# Patient Record
Sex: Female | Born: 1970 | Race: White | Hispanic: No | Marital: Married | Smoking: Current every day smoker
Health system: Southern US, Community
[De-identification: ages and names within clinical notes are randomized; demographics above are authoritative.]

## PROBLEM LIST (undated history)

## (undated) DIAGNOSIS — F32A Depression, unspecified: Secondary | ICD-10-CM

## (undated) DIAGNOSIS — K824 Cholesterolosis of gallbladder: Secondary | ICD-10-CM

## (undated) DIAGNOSIS — F419 Anxiety disorder, unspecified: Secondary | ICD-10-CM

## (undated) DIAGNOSIS — J45909 Unspecified asthma, uncomplicated: Secondary | ICD-10-CM

## (undated) DIAGNOSIS — F329 Major depressive disorder, single episode, unspecified: Secondary | ICD-10-CM

## (undated) DIAGNOSIS — J302 Other seasonal allergic rhinitis: Secondary | ICD-10-CM

## (undated) DIAGNOSIS — O24419 Gestational diabetes mellitus in pregnancy, unspecified control: Secondary | ICD-10-CM

## (undated) DIAGNOSIS — I10 Essential (primary) hypertension: Secondary | ICD-10-CM

## (undated) DIAGNOSIS — K819 Cholecystitis, unspecified: Secondary | ICD-10-CM

## (undated) HISTORY — DX: Anxiety disorder, unspecified: F41.9

## (undated) HISTORY — DX: Essential (primary) hypertension: I10

## (undated) HISTORY — DX: Cholesterolosis of gallbladder: K82.4

## (undated) HISTORY — DX: Major depressive disorder, single episode, unspecified: F32.9

## (undated) HISTORY — PX: TUBAL LIGATION: SHX77

## (undated) HISTORY — DX: Unspecified asthma, uncomplicated: J45.909

## (undated) HISTORY — PX: RHINOPLASTY: SHX2354

## (undated) HISTORY — DX: Depression, unspecified: F32.A

---

## 1999-02-22 ENCOUNTER — Emergency Department (HOSPITAL_COMMUNITY): Admission: EM | Admit: 1999-02-22 | Discharge: 1999-02-22 | Payer: Self-pay | Admitting: Emergency Medicine

## 2000-03-02 ENCOUNTER — Other Ambulatory Visit: Admission: RE | Admit: 2000-03-02 | Discharge: 2000-03-02 | Payer: Self-pay | Admitting: Family Medicine

## 2001-04-19 ENCOUNTER — Other Ambulatory Visit: Admission: RE | Admit: 2001-04-19 | Discharge: 2001-04-19 | Payer: Self-pay | Admitting: Family Medicine

## 2001-08-02 ENCOUNTER — Other Ambulatory Visit: Admission: RE | Admit: 2001-08-02 | Discharge: 2001-08-02 | Payer: Self-pay | Admitting: Family Medicine

## 2003-03-08 ENCOUNTER — Other Ambulatory Visit: Admission: RE | Admit: 2003-03-08 | Discharge: 2003-03-08 | Payer: Self-pay | Admitting: *Deleted

## 2003-03-08 ENCOUNTER — Other Ambulatory Visit: Admission: RE | Admit: 2003-03-08 | Discharge: 2003-03-08 | Payer: Self-pay | Admitting: Family Medicine

## 2003-05-29 ENCOUNTER — Emergency Department (HOSPITAL_COMMUNITY): Admission: EM | Admit: 2003-05-29 | Discharge: 2003-05-29 | Payer: Self-pay | Admitting: Emergency Medicine

## 2003-06-05 ENCOUNTER — Emergency Department (HOSPITAL_COMMUNITY): Admission: EM | Admit: 2003-06-05 | Discharge: 2003-06-06 | Payer: Self-pay | Admitting: Emergency Medicine

## 2004-06-15 ENCOUNTER — Other Ambulatory Visit: Admission: RE | Admit: 2004-06-15 | Discharge: 2004-06-15 | Payer: Self-pay | Admitting: Family Medicine

## 2004-06-29 ENCOUNTER — Emergency Department (HOSPITAL_COMMUNITY): Admission: EM | Admit: 2004-06-29 | Discharge: 2004-06-29 | Payer: Self-pay | Admitting: Emergency Medicine

## 2006-12-03 ENCOUNTER — Emergency Department (HOSPITAL_COMMUNITY): Admission: EM | Admit: 2006-12-03 | Discharge: 2006-12-04 | Payer: Self-pay | Admitting: Emergency Medicine

## 2006-12-09 ENCOUNTER — Ambulatory Visit (HOSPITAL_COMMUNITY): Admission: RE | Admit: 2006-12-09 | Discharge: 2006-12-09 | Payer: Self-pay | Admitting: Family Medicine

## 2009-12-05 ENCOUNTER — Ambulatory Visit: Payer: Self-pay | Admitting: Family Medicine

## 2009-12-05 DIAGNOSIS — R300 Dysuria: Secondary | ICD-10-CM | POA: Insufficient documentation

## 2009-12-05 LAB — CONVERTED CEMR LAB
Beta hcg, urine, semiquantitative: NEGATIVE
Bilirubin Urine: NEGATIVE
Glucose, Urine, Semiquant: NEGATIVE
Ketones, urine, test strip: NEGATIVE
Protein, U semiquant: NEGATIVE
Urobilinogen, UA: 0.2
pH: 5.5

## 2009-12-06 ENCOUNTER — Encounter: Payer: Self-pay | Admitting: Family Medicine

## 2009-12-08 ENCOUNTER — Telehealth (INDEPENDENT_AMBULATORY_CARE_PROVIDER_SITE_OTHER): Payer: Self-pay

## 2010-01-23 ENCOUNTER — Ambulatory Visit (HOSPITAL_COMMUNITY)
Admission: RE | Admit: 2010-01-23 | Discharge: 2010-01-23 | Payer: Self-pay | Source: Home / Self Care | Attending: Family Medicine | Admitting: Family Medicine

## 2010-02-26 NOTE — Assessment & Plan Note (Signed)
Summary: Frequent,painful, itchy urination x 7 dys rm 3   Vital Signs:  Patient Profile:   40 Years Old Female CC:      Frequent, painful urination x 7dys LMP:     10/09/2009 Height:     68.5 inches Weight:      192 pounds O2 Sat:      100 % O2 treatment:    Room Air Temp:     98.6 degrees F oral Pulse rate:   77 / minute Pulse rhythm:   regular Resp:     16 per minute BP sitting:   126 / 81  (left arm) Cuff size:   small  Vitals Entered By: Areta Haber CMA (December 05, 2009 1:06 PM)  Menstrual History: LMP (date): 10/09/2009 LMP - Character: normal LMP - Reliable: No                  Current Allergies (reviewed today): ! PENICILLIN        History of Present Illness Chief Complaint: Frequent, painful urination x 7dys History of Present Illness:  Subjective:  Patient presents complaining of UTI symptoms for one week.  Complains of dysuria, frequency, and urgency.  No hematuria or nocturia.  No abnormal vaginal discharge.  No fever/chills/sweats.  No abdominal pain or pelvic pain.  No flank pain.  No nausea/vomiting.  Last menstrual period normal.   Current Problems: DYSURIA (ICD-788.1) FAMILY HISTORY DIABETES 1ST DEGREE RELATIVE (ICD-V18.0)   Current Meds BACTRIM DS 800-160 MG TABS (SULFAMETHOXAZOLE-TRIMETHOPRIM)   REVIEW OF SYSTEMS Constitutional Symptoms      Denies fever, chills, night sweats, weight loss, weight gain, and fatigue.  Eyes       Denies change in vision, eye pain, eye discharge, glasses, contact lenses, and eye surgery. Ear/Nose/Throat/Mouth       Denies hearing loss/aids, change in hearing, ear pain, ear discharge, dizziness, frequent runny nose, frequent nose bleeds, sinus problems, sore throat, hoarseness, and tooth pain or bleeding.  Respiratory       Denies dry cough, productive cough, wheezing, shortness of breath, asthma, bronchitis, and emphysema/COPD.  Cardiovascular       Denies murmurs, chest pain, and tires easily with  exhertion.    Gastrointestinal       Denies stomach pain, nausea/vomiting, diarrhea, constipation, blood in bowel movements, and indigestion. Genitourniary       Complains of painful urination.      Denies kidney stones and loss of urinary control.      Comments: Frequent, itchy x 7 dys Neurological       Denies paralysis, seizures, and fainting/blackouts. Musculoskeletal       Denies muscle pain, joint pain, joint stiffness, decreased range of motion, redness, swelling, muscle weakness, and gout.  Skin       Denies bruising, unusual mles/lumps or sores, and hair/skin or nail changes.  Psych       Denies mood changes, temper/anger issues, anxiety/stress, speech problems, depression, and sleep problems. Other Comments: Pt states she was seen @ [facility] on 11/27/09, Dx UTI, prescribed Bactrim DS. Pt states her Sx have not resolved.    Past History:  Past Medical History: Unremarkable  Past Surgical History: Tubal ligation Rhinoplasty  Family History: Family History Diabetes 1st degree relative Family History High cholesterol Family History Hypertension  Social History: Married Current Smoker - > 1 pack daily Alcohol use-no Drug use-no Regular exercise-no Smoking Status:  current Drug Use:  no Does Patient Exercise:  no   Objective:  No acute distress  Eyes:  Pupils are equal, round, and reactive to light and accomdation.  Extraocular movement is intact.  Conjunctivae are not inflamed.  Mouth:  moist mucous membranes  Neck:  Supple.  No adenopathy is present.  Lungs:  Clear to auscultation.  Breath sounds are equal.  Heart:  Regular rate and rhythm without murmurs, rubs, or gallops.  Abdomen:  Nontender without masses or hepatosplenomegaly.  Bowel sounds are present.  No CVA or flank tenderness.  Skin:  No rash urinalysis (dipstick):  trace blood Urine HCG negative Assessment New Problems: DYSURIA (ICD-788.1) FAMILY HISTORY DIABETES 1ST DEGREE RELATIVE  (ICD-V18.0)  SYMPTOMS SUGGESTIVE OF CYSTITIS  Plan New Orders: T-Culture, Urine [[id number]] Urinalysis [81003-65000] Urine Pregnancy Test  [81025] New Patient Level III [62952] Planning Comments:   Urine culture pending.  Increase fluid intake. Begin Bactrim.  May use OTC Azo Standard. Follow-up with PCP if not improving.   The patient and/or caregiver has been counseled thoroughly with regard to medications prescribed including dosage, schedule, interactions, rationale for use, and possible side effects and they verbalize understanding.  Diagnoses and expected course of recovery discussed and will return if not improved as expected or if the condition worsens. Patient and/or caregiver verbalized understanding.   Orders Added: 1)  T-Culture, Urine [[id number]] 2)  Urinalysis [81003-65000] 3)  Urine Pregnancy Test  [81025] 4)  New Patient Level III [99203]    Laboratory Results   Urine Tests  Date/Time Received: December 05, 2009 1:38 PM  Date/Time Reported: December 05, 2009 1:38 PM   Routine Urinalysis   Color: yellow Appearance: Clear Glucose: negative   (Normal Range: Negative) Bilirubin: negative   (Normal Range: Negative) Ketone: negative   (Normal Range: Negative) Spec. Gravity: 1.010   (Normal Range: 1.003-1.035) Blood: trace-lysed   (Normal Range: Negative) pH: 5.5   (Normal Range: 5.0-8.0) Protein: negative   (Normal Range: Negative) Urobilinogen: 0.2   (Normal Range: 0-1) Nitrite: negative   (Normal Range: Negative) Leukocyte Esterace: negative   (Normal Range: Negative)    Urine HCG: negative

## 2010-02-26 NOTE — Progress Notes (Signed)
Summary: Courtesy call  Phone Note Outgoing Call   Call placed by: Areta Haber CMA,  December 08, 2009 9:53 AM Call placed to: Patient Summary of Call: Courtesy call to pt - Courtesy mess LOVM of cell number. Initial call taken by: Areta Haber CMA,  December 08, 2009 9:54 AM

## 2011-04-07 ENCOUNTER — Emergency Department
Admission: EM | Admit: 2011-04-07 | Discharge: 2011-04-07 | Disposition: A | Discharge status: Home Health | Payer: Self-pay | Source: Home / Self Care | Attending: Emergency Medicine | Admitting: Emergency Medicine

## 2011-04-07 ENCOUNTER — Encounter: Payer: Self-pay | Admitting: *Deleted

## 2011-04-07 DIAGNOSIS — B029 Zoster without complications: Secondary | ICD-10-CM

## 2011-04-07 HISTORY — DX: Other seasonal allergic rhinitis: J30.2

## 2011-04-07 MED ORDER — PREDNISONE 10 MG PO TABS: ORAL_TABLET | ORAL | Status: DC

## 2011-04-07 MED ORDER — ACYCLOVIR 400 MG PO TABS: 800.0000 mg | ORAL_TABLET | Freq: Every day | ORAL | Status: AC

## 2011-04-07 NOTE — ED Provider Notes (Addendum)
History     CSN: [id number]  Arrival date & time 04/07/11  1122   First MD Initiated Contact with Patient 04/07/11 1203      Chief Complaint  Patient presents with  . Rash    (Consider location/radiation/quality/duration/timing/severity/associated sxs/prior treatment) HPI This patient states that she has a stinging burning sensation that she describes as severe around her right hip and pelvis. She states that she broke out with some small red bumps on the anterior aspect. She's been using Benadryl which is not helping very much. She states that she was handling firewood about 3 days ago and her symptoms started after this so she wonders if she has poison ivy. She has been under a lot of stress recently with the death of her father and other home stressors. No fever chills or nausea or vomiting. She does not have health insurance.  Past Medical History  Diagnosis Date  . Seasonal allergies     Past Surgical History  Procedure Date  . Rhinoplasty   . Tubal ligation     Family History  Problem Relation Age of Onset  . Hypertension Mother   . Diabetes Mother   . Seizures Father   . Hypertension Father     History  Substance Use Topics  . Smoking status: Current Everyday Smoker -- 20 years    Types: Cigarettes  . Smokeless tobacco: Not on file  . Alcohol Use: No    OB History    Grav Para Term Preterm Abortions TAB SAB Ect Mult Living                  Review of Systems  Allergies  Penicillins  Home Medications   Current Outpatient Rx  Name Route Sig Dispense Refill  . MULTIVITAL PO Oral Take by mouth.    . ACYCLOVIR 400 MG PO TABS Oral Take 2 tablets (800 mg total) by mouth 5 (five) times daily. 50 tablet 0  . PREDNISONE 10 MG PO TABS  20mg  bid for 3 days, then 10mg  bid for 3 days, then 10mg  daily for 3 days, then stop.  Disp QS 21 tablet 0    BP 128/77  Pulse 72  Temp(Src) 98.8 F (37.1 C) (Oral)  Resp 14  Ht 5' 9.5" (1.765 m)  Wt 191 lb (86.637 kg)   BMI 27.80 kg/m2  SpO2 100%  Physical Exam  Nursing note and vitals reviewed. Constitutional: She is oriented to person, place, and time. She appears well-developed and well-nourished.  HENT:  Head: Normocephalic and atraumatic.  Eyes: No scleral icterus.  Neck: Neck supple.  Cardiovascular: Regular rhythm and normal heart sounds.   Pulmonary/Chest: Effort normal and breath sounds normal. No respiratory distress.  Neurological: She is alert and oriented to person, place, and time.  Skin: Skin is warm and dry.       She has a tiny pinpoint erythema on the anterior inguinal area on the right. She also has hyperesthesia in the L1 distribution on the right side only. Full range of motion of her hip area and distal neurovascular status intact.  Psychiatric: She has a normal mood and affect. Her speech is normal.    ED Course  Procedures (including critical care time)  Labs Reviewed - No data to display No results found.   1. Shingles       MDM   Distribution of her is a neuropathy or sensation, she likely a shingles. She does not have insurance however I still feel that  she requires treatment with an antiviral. We will treat with acyclovir and also prednisone to decrease inflammation. As of right now she only has a very minimal rash on the anterior aspect, however the distribution of her sensation is consistent with herpes zoster. If she has further problems, followup with her PCP or call clinic.    Marlaine Hind, MD 04/07/11 1210  Marlaine Hind, MD 04/07/11 [phone]

## 2011-04-07 NOTE — ED Notes (Addendum)
Patient c/o small red rash to right hip/pelvis x 3 days. C/o severe burning and itching. Used benadryl without relief. Patient has had chicken pox in the past.

## 2011-04-13 ENCOUNTER — Emergency Department
Admission: EM | Admit: 2011-04-13 | Discharge: 2011-04-13 | Disposition: A | Discharge status: Home / Self Care | Payer: Self-pay | Source: Home / Self Care | Attending: Family Medicine | Admitting: Family Medicine

## 2011-04-13 ENCOUNTER — Encounter: Payer: Self-pay | Admitting: *Deleted

## 2011-04-13 DIAGNOSIS — J069 Acute upper respiratory infection, unspecified: Secondary | ICD-10-CM

## 2011-04-13 MED ORDER — AZITHROMYCIN 250 MG PO TABS: ORAL_TABLET | ORAL | Status: AC

## 2011-04-13 NOTE — Discharge Instructions (Signed)
Increase fluid intake, take antibiotics as prescribed.  Begin antihistamine of your choice (Zyrtec or Claritin).  Finish your last dose of your acyclovir. Recommend you speaking to a grief counselor in regards to your fathers recent death to assist in your coping.

## 2011-04-13 NOTE — ED Provider Notes (Signed)
History     CSN: [id number]  Arrival date & time 04/13/11  1042   First MD Initiated Contact with Patient 04/13/11 1104      Chief Complaint  Patient presents with  . Sore Throat  . Dizziness    (Consider location/radiation/quality/duration/timing/severity/associated sxs/prior treatment) Patient is a 41 y.o. female presenting with sinusitis. The history is provided by the patient.  Sinusitis  This is a recurrent problem. The current episode started more than 1 week ago (2-3 months). The problem has not changed since onset.The maximum temperature recorded prior to her arrival was 100 to 100.9 F. The fever has been present for 1 to 2 days. The pain is mild. Associated symptoms include chills, ear pain, sinus pressure and sore throat. Pertinent negatives include no swollen glands. She has tried drinking (honey, cinnamon, hot tea) for the symptoms. The treatment provided mild relief.    Past Medical History  Diagnosis Date  . Seasonal allergies     Past Surgical History  Procedure Date  . Rhinoplasty   . Tubal ligation     Family History  Problem Relation Age of Onset  . Hypertension Mother   . Diabetes Mother   . Seizures Father   . Hypertension Father     History  Substance Use Topics  . Smoking status: Current Everyday Smoker -- 1.0 packs/day for 20 years    Types: Cigarettes  . Smokeless tobacco: Not on file  . Alcohol Use: No    OB History    Grav Para Term Preterm Abortions TAB SAB Ect Mult Living                  Review of Systems  Constitutional: Positive for fever and chills.       General malaise  HENT: Positive for ear pain, sore throat, sneezing and sinus pressure.   All other systems reviewed and are negative.    Allergies  Penicillins  Home Medications   Current Outpatient Rx  Name Route Sig Dispense Refill  . ACYCLOVIR 400 MG PO TABS Oral Take 2 tablets (800 mg total) by mouth 5 (five) times daily. 50 tablet 0  . AZITHROMYCIN 250 MG PO  TABS  Azithromycin 500mg  on day 1, then 250mg  on days 2-4 6 tablet 0  . MULTIVITAL PO Oral Take by mouth.    Marland Kitchen PREDNISONE 10 MG PO TABS  20mg  bid for 3 days, then 10mg  bid for 3 days, then 10mg  daily for 3 days, then stop.  Disp QS 21 tablet 0    BP 128/82  Pulse 72  Temp(Src) 98.6 F (37 C) (Oral)  Resp 18  Ht 5' 9.5" (1.765 m)  Wt 195 lb (88.451 kg)  BMI 28.38 kg/m2  SpO2 98%  LMP 03/16/2011  Physical Exam  Constitutional: She appears well-developed.  HENT:  Head: Normocephalic and atraumatic.  Right Ear: External ear normal.  Left Ear: External ear normal.  Nose: Nose normal.  Mouth/Throat: Oropharynx is clear and moist.  Eyes: Conjunctivae and EOM are normal. Pupils are equal, round, and reactive to light.  Neck: Normal range of motion. Neck supple.       Anterior cervical tenderness  Cardiovascular: Normal rate, regular rhythm and normal heart sounds.   Pulmonary/Chest: Effort normal and breath sounds normal.  Lymphadenopathy:    She has no cervical adenopathy.  Skin: Skin is warm and dry.  Psychiatric: She has a normal mood and affect. Her behavior is normal.    ED Course  Procedures  Labs Reviewed - No data to display N/A No results found.   1. URI (upper respiratory infection)       MDM   Begin Z-pack       Lannie Fields, NP 04/13/11 1330  Lattie Haw, MD 04/13/11 [phone]

## 2011-04-13 NOTE — ED Notes (Signed)
Pt c/o sore throat, dizziness, and bilateral ear ache on and off x mths. She currently is being treated for shingles.

## 2013-04-30 ENCOUNTER — Other Ambulatory Visit (HOSPITAL_COMMUNITY): Payer: Self-pay | Admitting: *Deleted

## 2013-04-30 DIAGNOSIS — N6320 Unspecified lump in the left breast, unspecified quadrant: Secondary | ICD-10-CM

## 2013-05-09 ENCOUNTER — Ambulatory Visit (HOSPITAL_COMMUNITY)
Admission: RE | Admit: 2013-05-09 | Discharge: 2013-05-09 | Disposition: A | Discharge status: Home / Self Care | Payer: Self-pay | Source: Ambulatory Visit | Attending: *Deleted | Admitting: *Deleted

## 2013-05-09 ENCOUNTER — Other Ambulatory Visit (HOSPITAL_COMMUNITY): Payer: Self-pay | Admitting: *Deleted

## 2013-05-09 DIAGNOSIS — N6320 Unspecified lump in the left breast, unspecified quadrant: Secondary | ICD-10-CM

## 2013-05-09 DIAGNOSIS — N63 Unspecified lump in unspecified breast: Secondary | ICD-10-CM | POA: Insufficient documentation

## 2013-06-26 ENCOUNTER — Other Ambulatory Visit (HOSPITAL_COMMUNITY): Payer: Self-pay | Admitting: *Deleted

## 2013-06-26 DIAGNOSIS — N6320 Unspecified lump in the left breast, unspecified quadrant: Secondary | ICD-10-CM

## 2013-07-04 ENCOUNTER — Ambulatory Visit (HOSPITAL_COMMUNITY)
Admission: RE | Admit: 2013-07-04 | Discharge: 2013-07-04 | Disposition: A | Discharge status: Home / Self Care | Payer: Self-pay | Source: Ambulatory Visit | Attending: *Deleted | Admitting: *Deleted

## 2013-07-04 DIAGNOSIS — N63 Unspecified lump in unspecified breast: Secondary | ICD-10-CM | POA: Insufficient documentation

## 2013-07-04 DIAGNOSIS — N6320 Unspecified lump in the left breast, unspecified quadrant: Secondary | ICD-10-CM

## 2013-11-02 ENCOUNTER — Emergency Department
Admission: EM | Admit: 2013-11-02 | Discharge: 2013-11-02 | Disposition: A | Discharge status: Home / Self Care | Payer: Self-pay | Source: Home / Self Care | Attending: Emergency Medicine | Admitting: Emergency Medicine

## 2013-11-02 ENCOUNTER — Encounter: Payer: Self-pay | Admitting: Emergency Medicine

## 2013-11-02 DIAGNOSIS — J0100 Acute maxillary sinusitis, unspecified: Secondary | ICD-10-CM

## 2013-11-02 DIAGNOSIS — H6500 Acute serous otitis media, unspecified ear: Secondary | ICD-10-CM

## 2013-11-02 MED ORDER — FLUTICASONE PROPIONATE 50 MCG/ACT NA SUSP: NASAL | Status: DC

## 2013-11-02 MED ORDER — AZITHROMYCIN 250 MG PO TABS: ORAL_TABLET | ORAL | Status: DC

## 2013-11-02 NOTE — ED Provider Notes (Signed)
CSN: [id number]     Arrival date & time 11/02/13  1555 History   First MD Initiated Contact with Patient 11/02/13 1606     Chief Complaint  Patient presents with  . Facial Pain  . Ear Fullness   (Consider location/radiation/quality/duration/timing/severity/associated sxs/prior Treatment) HPI SINUSITIS  Onset: 21 days Facial/sinus pressure with discolored nasal mucus.    Severity: moderate Tried OTC meds without significant relief.  Symptoms:  + Fever  + URI prodrome with nasal congestion + Minimal swollen neck glands + mild Sinus Headache + mild ear pressure  had mild vertigo one week ago and had mild injury left wrist and left thumb after falling to brace herself. No loss of consciousness. No other musculoskeletal complaints. No numbness or weakness of left wrist or thumb. The vertigo has since resolved and she denies any focal neurologic symptoms or visual change  + Seasonal Allergy symptoms No significant Sore Throat No eye symptoms     No significant Cough No chest pain No shortness of breath  No wheezing  No Abdominal Pain No Nausea No Vomiting No diarrhea  No Myalgias No focal neurologic symptoms No syncope No Rash  No Urinary symptoms    Past Medical History  Diagnosis Date  . Seasonal allergies    Past Surgical History  Procedure Laterality Date  . Rhinoplasty    . Tubal ligation     Family History  Problem Relation Age of Onset  . Hypertension Mother   . Diabetes Mother   . Seizures Father   . Hypertension Father    History  Substance Use Topics  . Smoking status: Current Every Day Smoker -- 1.00 packs/day for 20 years    Types: Cigarettes  . Smokeless tobacco: Not on file  . Alcohol Use: No   OB History   Grav Para Term Preterm Abortions TAB SAB Ect Mult Living                 Review of Systems  Neurological: Negative for seizures, syncope and facial asymmetry.  All other systems reviewed and are negative.   Allergies   Penicillins  Home Medications   Prior to Admission medications   Medication Sig Start Date End Date Taking? Authorizing Provider  azithromycin (ZITHROMAX Z-PAK) 250 MG tablet Take 2 tablets on day one, then 1 tablet daily on days 2 through 5 11/02/13   Lajean Manesavid Massey, MD  fluticasone [facility](FLONASE) 50 MCG/ACT nasal spray 1 or 2 sprays each nostril twice a day 11/02/13   Lajean Manesavid Massey, MD  Multiple Vitamins-Minerals (MULTIVITAL PO) Take by mouth.    Historical Provider, MD   BP 126/82  Pulse 71  Temp(Src) 98.1 F (36.7 C) (Oral)  Wt 171 lb 4 oz (77.678 kg)  SpO2 97%  LMP 10/19/2013 Physical Exam  Nursing note and vitals reviewed. Constitutional: She is oriented to person, place, and time. She appears well-developed and well-nourished. No distress.  HENT:  Head: Normocephalic and atraumatic.  Right Ear: External ear and ear canal normal.  Left Ear: External ear and ear canal normal.  Nose: Mucosal edema and rhinorrhea present. Right sinus exhibits maxillary sinus tenderness. Left sinus exhibits maxillary sinus tenderness.  Mouth/Throat: Oropharynx is clear and moist. No oral lesions. No oropharyngeal exudate.  Both TMs are mildly injected with serous otitis media and air-fluid levels. TMs intact.  Eyes: Right eye exhibits no discharge. Left eye exhibits no discharge. No scleral icterus.  Neck: Neck supple.  Cardiovascular: Normal rate, regular rhythm and normal heart sounds.  Pulmonary/Chest: Effort normal and breath sounds normal. She has no wheezes. She has no rales.  Musculoskeletal:  Mild left wrist and thumb tenderness without deformity. Range of motion and hand grip normal. I offered x-rays today, but she declined.  Lymphadenopathy:    She has no cervical adenopathy.  Neurological: She is alert and oriented to person, place, and time. She has normal strength. No cranial nerve deficit or sensory deficit. She displays a negative Romberg sign.  Skin: Skin is warm and dry. No rash noted.   Psychiatric: She has a normal mood and affect.    ED Course  Procedures (including critical care time) Labs Review Labs Reviewed - No data to display  Imaging Review No results found.   MDM   1. Acute maxillary sinusitis, recurrence not specified   2. Acute serous otitis media, recurrence not specified, unspecified laterality     Over 25 minutes spent, greater than 50% of the time spent for counseling and coordination of care. She declined prednisone or Depo-Medrol severe seasonal allergy component to her symptoms. May use OTC antihistamine, but don't take if this over-dries. Prescribed Z-Pak and Flonase Other symptomatic care discussed Follow-up with your primary care doctor in 5-7 days if not improving, or sooner if symptoms become worse. Precautions discussed. Red flags discussed. Questions invited and answered. Patient voiced understanding and agreement.   Lajean Manesavid Massey, MD 11/04/13 1945

## 2013-11-02 NOTE — ED Notes (Signed)
Pt c/o sinus pain, ear pain, sore throat and intermittent dizziness, etc for 3 weeks.  Pt has taken OTC ("Alka Seltzer"), but sx remain. Denies any N/V until today- some nausea.    Seven days ago today, pt stated that she became dizzy and fell down in kitchen floor, but did not lose consciousness.  Pt also complaining of left wrist and left thumb pain related to fall.

## 2013-11-13 ENCOUNTER — Encounter: Payer: Self-pay | Admitting: Emergency Medicine

## 2013-11-13 ENCOUNTER — Emergency Department
Admission: EM | Admit: 2013-11-13 | Discharge: 2013-11-13 | Disposition: A | Discharge status: Home / Self Care | Payer: Self-pay | Source: Home / Self Care | Attending: Physician Assistant | Admitting: Physician Assistant

## 2013-11-13 DIAGNOSIS — J0101 Acute recurrent maxillary sinusitis: Secondary | ICD-10-CM

## 2013-11-13 MED ORDER — DOXYCYCLINE HYCLATE 100 MG PO TABS: 100.0000 mg | ORAL_TABLET | Freq: Two times a day (BID) | ORAL | Status: AC

## 2013-11-13 MED ORDER — PREDNISONE 50 MG PO TABS: 50.0000 mg | ORAL_TABLET | Freq: Every day | ORAL | Status: DC

## 2013-11-13 NOTE — Discharge Instructions (Signed)
Consider beta glucan. If not improving may need CT scan to look at sinuses.   Sinusitis Sinusitis is redness, soreness, and inflammation of the paranasal sinuses. Paranasal sinuses are air pockets within the bones of your face (beneath the eyes, the middle of the forehead, or above the eyes). In healthy paranasal sinuses, mucus is able to drain out, and air is able to circulate through them by way of your nose. However, when your paranasal sinuses are inflamed, mucus and air can become trapped. This can allow bacteria and other germs to grow and cause infection. Sinusitis can develop quickly and last only a short time (acute) or continue over a long period (chronic). Sinusitis that lasts for more than 12 weeks is considered chronic.  CAUSES  Causes of sinusitis include:  Allergies.  Structural abnormalities, such as displacement of the cartilage that separates your nostrils (deviated septum), which can decrease the air flow through your nose and sinuses and affect sinus drainage.  Functional abnormalities, such as when the small hairs (cilia) that line your sinuses and help remove mucus do not work properly or are not present. SIGNS AND SYMPTOMS  Symptoms of acute and chronic sinusitis are the same. The primary symptoms are pain and pressure around the affected sinuses. Other symptoms include:  Upper toothache.  Earache.  Headache.  Bad breath.  Decreased sense of smell and taste.  A cough, which worsens when you are lying flat.  Fatigue.  Fever.  Thick drainage from your nose, which often is green and may contain pus (purulent).  Swelling and warmth over the affected sinuses. DIAGNOSIS  Your health care provider will perform a physical exam. During the exam, your health care provider may:  Look in your nose for signs of abnormal growths in your nostrils (nasal polyps).  Tap over the affected sinus to check for signs of infection.  View the inside of your sinuses  (endoscopy) using an imaging device that has a light attached (endoscope). If your health care provider suspects that you have chronic sinusitis, one or more of the following tests may be recommended:  Allergy tests.  Nasal culture. A sample of mucus is taken from your nose, sent to a lab, and screened for bacteria.  Nasal cytology. A sample of mucus is taken from your nose and examined by your health care provider to determine if your sinusitis is related to an allergy. TREATMENT  Most cases of acute sinusitis are related to a viral infection and will resolve on their own within 10 days. Sometimes medicines are prescribed to help relieve symptoms (pain medicine, decongestants, nasal steroid sprays, or saline sprays).  However, for sinusitis related to a bacterial infection, your health care provider will prescribe antibiotic medicines. These are medicines that will help kill the bacteria causing the infection.  Rarely, sinusitis is caused by a fungal infection. In theses cases, your health care provider will prescribe antifungal medicine. For some cases of chronic sinusitis, surgery is needed. Generally, these are cases in which sinusitis recurs more than 3 times per year, despite other treatments. HOME CARE INSTRUCTIONS   Drink plenty of water. Water helps thin the mucus so your sinuses can drain more easily.  Use a humidifier.  Inhale steam 3 to 4 times a day (for example, sit in the bathroom with the shower running).  Apply a warm, moist washcloth to your face 3 to 4 times a day, or as directed by your health care provider.  Use saline nasal sprays to help moisten  and clean your sinuses.  Take medicines only as directed by your health care provider.  If you were prescribed either an antibiotic or antifungal medicine, finish it all even if you start to feel better. SEEK IMMEDIATE MEDICAL CARE IF:  You have increasing pain or severe headaches.  You have nausea, vomiting, or  drowsiness.  You have swelling around your face.  You have vision problems.  You have a stiff neck.  You have difficulty breathing. MAKE SURE YOU:   Understand these instructions.  Will watch your condition.  Will get help right away if you are not doing well or get worse. Document Released: 01/11/2005 Document Revised: 05/28/2013 Document Reviewed: 01/26/2011 [facility] Patient Information 2015 [place], [place]. This information is not intended to replace advice given to you by your health care provider. Make sure you discuss any questions you have with your health care provider.

## 2013-11-13 NOTE — ED Notes (Signed)
Pt c/o bilateral ear pain, sinus pressure, dental pain, and HA x 4 wks. Denies fever.

## 2013-11-13 NOTE — ED Provider Notes (Signed)
CSN: [id number]     Arrival date & time 11/13/13  1441 History   First MD Initiated Contact with Patient 11/13/13 1452     Chief Complaint  Patient presents with  . Dental Pain  . Nasal Congestion  . Otalgia   (Consider location/radiation/quality/duration/timing/severity/associated sxs/prior Treatment) HPI Pt is a 43 yo female who presents to the clinic with unresolved bilateral ear pain, sinus pressure, dental pain and HA for last 4 weeks. She was seen on 10/9 here in [facility] and given zpak and flonase. She has finished zpak and using flonase. She does not feel any better. She still has HA and sinus pressure symptoms. Pressure is worse with head position changes. Denies any fever, cough, SOB or wheezing. NO ST.  She has long hx allergies. She does not have insurance but previously was receiving allergy shots. 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   Past Medical History  Diagnosis Date  . Seasonal allergies    Past Surgical History  Procedure Laterality Date  . Rhinoplasty    . Tubal ligation     Family History  Problem Relation Age of Onset  . Hypertension Mother   . Diabetes Mother   . Seizures Father   . Hypertension Father    History  Substance Use Topics  . Smoking status: Current Every Day Smoker -- 1.00 packs/day for 20 years    Types: Cigarettes  . Smokeless tobacco: Not on file  . Alcohol Use: No   OB History   Grav Para Term Preterm Abortions TAB SAB Ect Mult Living                 Review of Systems  All other systems reviewed and are negative.   Allergies  Penicillins  Home Medications   Prior to Admission medications   Medication Sig Start Date End Date Taking? Authorizing Provider  azithromycin (ZITHROMAX Z-PAK) 250 MG tablet Take 2 tablets on day one, then 1 tablet daily on days 2 through 5 11/02/13   Lajean Manesavid Massey, MD  doxycycline (VIBRA-TABS) 100 MG tablet Take 1 tablet (100 mg total) by mouth 2 (two) times daily. For 10 days. 11/13/13 11/20/13   Jomarie LongsJade L Breeback, PA-C  fluticasone (FLONASE) 50 MCG/ACT nasal spray 1 or 2 sprays each nostril twice a day 11/02/13   Lajean Manesavid Massey, MD  Multiple Vitamins-Minerals (MULTIVITAL PO) Take by mouth.    Historical Provider, MD  predniSONE (DELTASONE) 50 MG tablet Take 1 tablet (50 mg total) by mouth daily. 11/13/13   Jade L Breeback, PA-C   BP 118/78  Pulse 93  Temp(Src) 98 F (36.7 C) (Oral)  Resp 16  Ht 5\' 9"  (1.753 m)  Wt 172 lb (78.019 kg)  BMI 25.39 kg/m2  SpO2 98%  LMP 10/19/2013 Physical Exam  Constitutional: She is oriented to person, place, and time. She appears well-developed and well-nourished.  HENT:  Head: Normocephalic and atraumatic.  Mouth/Throat: Oropharynx is clear and moist. No oropharyngeal exudate.  TM's have fluid air bubbles and appear erythematous. No blood or pus.  Maxillary and frontal sinus tenderness to palpation.  Nasal turbinates red and swollen bilaterally.   Eyes: Conjunctivae are normal. Right eye exhibits no discharge. Left eye exhibits no discharge.  Neck: Normal range of motion. Neck supple.  Cardiovascular: Normal rate, regular rhythm and normal heart sounds.   Pulmonary/Chest: Effort normal and breath sounds normal.  Lymphadenopathy:    She has no cervical adenopathy.  Neurological: She is alert and oriented to  person, place, and time.  Skin: Skin is dry.  Psychiatric: She has a normal mood and affect. Her behavior is normal.    ED Course  Procedures (including critical care time) Labs Review Labs Reviewed - No data to display  Imaging Review No results found.   MDM   1. Acute recurrent maxillary sinusitis    Certainly feel like allergies are making this situation worse.  Consider daily zyrtec or allegra with flonase. Also consider beta glucan. Discussed neti-pot.  Will give doxycycline for 10 days and prednisone for 5 days.  If sinus pressure and dental pain does not resolve then needs CT scan of sinuses.  Follow up if not improving.      Jomarie LongsJade L Breeback, PA-C 11/13/13 1521

## 2014-02-04 ENCOUNTER — Other Ambulatory Visit (HOSPITAL_COMMUNITY): Payer: Self-pay | Admitting: *Deleted

## 2014-02-04 DIAGNOSIS — N6320 Unspecified lump in the left breast, unspecified quadrant: Secondary | ICD-10-CM

## 2014-02-08 ENCOUNTER — Other Ambulatory Visit (HOSPITAL_COMMUNITY): Payer: Self-pay | Admitting: *Deleted

## 2014-02-08 DIAGNOSIS — N6320 Unspecified lump in the left breast, unspecified quadrant: Secondary | ICD-10-CM

## 2014-02-08 DIAGNOSIS — Z09 Encounter for follow-up examination after completed treatment for conditions other than malignant neoplasm: Secondary | ICD-10-CM

## 2014-02-19 ENCOUNTER — Ambulatory Visit (HOSPITAL_COMMUNITY)
Admission: RE | Admit: 2014-02-19 | Discharge: 2014-02-19 | Disposition: A | Discharge status: Home / Self Care | Payer: Self-pay | Source: Ambulatory Visit | Attending: *Deleted | Admitting: *Deleted

## 2014-02-19 DIAGNOSIS — Z09 Encounter for follow-up examination after completed treatment for conditions other than malignant neoplasm: Secondary | ICD-10-CM

## 2014-02-19 DIAGNOSIS — N6002 Solitary cyst of left breast: Secondary | ICD-10-CM | POA: Insufficient documentation

## 2014-02-19 DIAGNOSIS — N6320 Unspecified lump in the left breast, unspecified quadrant: Secondary | ICD-10-CM

## 2014-06-10 ENCOUNTER — Other Ambulatory Visit (HOSPITAL_COMMUNITY): Payer: Self-pay | Admitting: Nurse Practitioner

## 2014-06-10 DIAGNOSIS — Z1231 Encounter for screening mammogram for malignant neoplasm of breast: Secondary | ICD-10-CM

## 2014-07-03 ENCOUNTER — Ambulatory Visit (HOSPITAL_COMMUNITY): Payer: Self-pay

## 2014-07-12 ENCOUNTER — Ambulatory Visit (HOSPITAL_COMMUNITY)
Admission: RE | Admit: 2014-07-12 | Discharge: 2014-07-12 | Disposition: A | Discharge status: Home / Self Care | Payer: Self-pay | Source: Ambulatory Visit | Attending: Nurse Practitioner | Admitting: Nurse Practitioner

## 2014-07-12 DIAGNOSIS — Z1231 Encounter for screening mammogram for malignant neoplasm of breast: Secondary | ICD-10-CM

## 2016-01-22 ENCOUNTER — Other Ambulatory Visit: Payer: Self-pay | Admitting: Emergency Medicine

## 2016-01-22 ENCOUNTER — Ambulatory Visit (INDEPENDENT_AMBULATORY_CARE_PROVIDER_SITE_OTHER): Payer: Self-pay

## 2016-01-22 DIAGNOSIS — M79644 Pain in right finger(s): Secondary | ICD-10-CM

## 2016-01-22 DIAGNOSIS — R52 Pain, unspecified: Secondary | ICD-10-CM

## 2016-03-15 ENCOUNTER — Other Ambulatory Visit: Payer: Self-pay | Admitting: Family Medicine

## 2016-03-15 DIAGNOSIS — Z1231 Encounter for screening mammogram for malignant neoplasm of breast: Secondary | ICD-10-CM

## 2016-03-30 ENCOUNTER — Ambulatory Visit: Payer: Self-pay

## 2016-03-30 ENCOUNTER — Ambulatory Visit
Admission: RE | Admit: 2016-03-30 | Discharge: 2016-03-30 | Disposition: A | Discharge status: Home / Self Care | Payer: 59 | Source: Ambulatory Visit | Attending: Family Medicine | Admitting: Family Medicine

## 2016-03-30 DIAGNOSIS — Z1231 Encounter for screening mammogram for malignant neoplasm of breast: Secondary | ICD-10-CM

## 2016-05-25 ENCOUNTER — Encounter: Payer: Self-pay | Admitting: Physician Assistant

## 2016-06-01 ENCOUNTER — Ambulatory Visit (INDEPENDENT_AMBULATORY_CARE_PROVIDER_SITE_OTHER): Payer: 59 | Admitting: Physician Assistant

## 2016-06-01 ENCOUNTER — Encounter: Payer: Self-pay | Admitting: Physician Assistant

## 2016-06-01 VITALS — BP 108/70 | HR 64 | Ht 69.0 in | Wt 190.6 lb

## 2016-06-01 DIAGNOSIS — K219 Gastro-esophageal reflux disease without esophagitis: Secondary | ICD-10-CM

## 2016-06-01 DIAGNOSIS — R1013 Epigastric pain: Secondary | ICD-10-CM | POA: Diagnosis not present

## 2016-06-01 DIAGNOSIS — R110 Nausea: Secondary | ICD-10-CM

## 2016-06-01 DIAGNOSIS — R1310 Dysphagia, unspecified: Secondary | ICD-10-CM

## 2016-06-01 NOTE — Progress Notes (Addendum)
Subjective:    Patient ID: Lacey Reyes, female    DOB: 1970-12-25, 46 y.o.   MRN: [id number]  HPI Lacey Reyes is a pleasant 46 year old white female, new to GI today referred by [facility]/Martha White NP. For evaluation of epigastric pain, nausea and reflux symptoms. She has not had any prior GI evaluation. Patient had undergone upper abdominal ultrasound on 04/27/2016 which did show small amount of gallbladder sludge, no gallbladder wall thickening. She is felt to have small gallbladder polyps versus non-shadowing stones measuring about 6 mm and no common bile duct dilation. She had been sent to a surgeon, Dr. Baltazar Apo in [facility] for consideration of cholecystectomy but apparently was felt that her symptoms may not be referrable to her gallbladder and GI evaluation was requested. Patient wishes to establish with Dr. Rhea Belton. She says she's been having most of her difficulty since January 2018 with ongoing problems with intermittent nausea subxiphoid discomfort and epigastric pain as well as belching and bloating. She had been started on Protonix 40 mg once daily which helped for a little while and then symptoms seem to persist. She says she's had problems in the past especially when she has gained weight as she has done in the past year eating up about 30 pounds. She says she's had reflux type symptoms previously with weight gain. Over the past week or so her dose of Protonix has been increased to 40 mg  twice daily and she says she is feeling much better. Her nausea has resolved and she's not had much discomfort. She does have occasional sensation of dysphagia to pills but feels as if they are sticking in the back of her throat not in her esophagus. She is also had very frequent throat clearing over the past year. H pylori serology done April 2013 was negative. No regular NSAID use.  Family history negative for colon cancer. Patient does have chronic problems with anxiety and  depression.  Review of Systems Pertinent positive and negative review of systems were noted in the above HPI section.  All other review of systems was otherwise negative.  Outpatient Encounter Prescriptions as of 06/01/2016  Medication Sig  . diphenhydrAMINE (BENADRYL) 25 MG tablet Take 25 mg by mouth at bedtime as needed.  . loratadine (CLARITIN) 10 MG tablet Take 1-2 tablets by mouth daily  . pantoprazole (PROTONIX) 40 MG tablet Take 40 mg by mouth 2 (two) times daily.  . [DISCONTINUED] azithromycin (ZITHROMAX Z-PAK) 250 MG tablet Take 2 tablets on day one, then 1 tablet daily on days 2 through 5  . [DISCONTINUED] fluticasone (FLONASE) 50 MCG/ACT nasal spray 1 or 2 sprays each nostril twice a day  . [DISCONTINUED] Multiple Vitamins-Minerals (MULTIVITAL PO) Take by mouth.  . [DISCONTINUED] predniSONE (DELTASONE) 50 MG tablet Take 1 tablet (50 mg total) by mouth daily.   No facility-administered encounter medications on file as of 06/01/2016.    Allergies  Allergen Reactions  . Adhesive [Tape]   . Ambien [Zolpidem Tartrate]   . Hydrocodone Itching  . Oxycodone Itching  . Penicillins     REACTION: anaphalaxis   Patient Active Problem List   Diagnosis Date Noted  . DYSURIA 12/05/2009   Social History   Social History  . Marital status: Legally Separated    Spouse name: N/A  . Number of children: N/A  . Years of education: N/A   Occupational History  . Not on file.   Social History Main Topics  . Smoking status:  Current Every Day Smoker    Packs/day: 1.00    Years: 20.00    Types: Cigarettes  . Smokeless tobacco: Never Used  . Alcohol use No  . Drug use: No  . Sexual activity: Not on file   Other Topics Concern  . Not on file   Social History Narrative  . No narrative on file    Lacey Reyes's family history includes Breast cancer in her maternal aunt; Colon polyps in her mother; Diabetes in her mother; Hypertension in her father and mother; Lung cancer in her  maternal grandfather; Ovarian cancer in her maternal aunt; Seizures in her father.      Objective:    Vitals:   06/01/16 1406  BP: 108/70  Pulse: 64    Physical Exam  well-developed white female in no acute distress, pleasant blood pressure 108/70 pulse 64, Height 5 foot 9, weight 190, BMI 28.1. HEENT ;nontraumatic normocephalic EOMI PERRLA sclerae anicteric, Cardio vascular ;regular rate and rhythm with S1-S2 no murmur or gallop, Pulmonary ;clear bilaterally, Abdomen; soft she has some tenderness in the epigastrium and right upper quadrant there is no guarding or rebound no palpable mass or hepatosplenomegaly bowel sounds are present, Rectal ;exam not done, Extremities; no clubbing cyanosis or edema skin warm and dry, Neuropsych ;mood and affect appropriate       Assessment & Plan:   #391 46 year old white female with several month history of frequent nausea, epigastric discomfort subxiphoid discomfort belching, bloating. At this point she has had a good response to twice a day Protonix with significant decrease in her symptoms which is consistent with chronic GERD She continues to complain of some nausea and has mild discomfort in her right upper quadrant on exam. Cannot rule out biliary dyskinesia  #2 gallbladder polyps versus non-shadowing stones with several structures measuring about 6 mm in size  #3 anxiety and depression  Plan; start strict antireflux regimen. Also discussed gradual weight loss program Continue Protonix 40 mg by mouth twice daily at least for the next couple of months. Will schedule for EGD with Dr. Rhea BeltonPyrtle. Procedure discussed in detail with patient including risks and benefits and she is agreeable to proceed Will also schedule for CCK HIDA scan.-If this is abnormal [facility] of Ch[facility]cyst[facility]. If CCK HIDA scan within normal limits she will need at least another ultrasound in 6 months to reassess probable  gallbladder polyps for stability/versus surgical referral.      Sammuel Coopermy S Esterwood PA-C 06/01/2016   Cc: April MansonWhite, Marsha L, NP  Addendum: Reviewed and agree with initial management. Pyrtle, Carie CaddyJay M, MD

## 2016-06-01 NOTE — Patient Instructions (Signed)
If you are age 47 or older, your body mass index should be between 23-30. Your Body mass index is 28.15 kg/m. If this is out of the aforementioned range listed, please consider follow up with your Primary Care Provider.  If you are age 47 or younger, your body mass index should be between 19-25. Your Body mass index is 28.15 kg/m. If this is out of the aformentioned range listed, please consider follow up with your Primary Care Provider.   You have been [facility]IDA [facility]1st floor[facility] on 06/22/16. Please arrive 15 minutes prior to your scheduled appointment at  845 am. Make certain not to have anything to eat after midnight the day prior to your test. Should this appointment date or time not work well for you, please call radiology scheduling at [phone].  _____________________________________________________________________ hepatobiliary (HIDA) scan is an imaging procedure used to diagnose problems in the liver, gallbladder and bile ducts. In the HIDA scan, a radioactive chemical or tracer is injected into a vein in your arm. The tracer is handled by the liver like bile. Bile is a fluid produced and excreted by your liver that helps your digestive system break down fats in the foods you eat. Bile is stored in your gallbladder and the gallbladder releases the bile when you eat a meal. A special nuclear medicine scanner (gamma camera) tracks the flow of the tracer from your liver into your gallbladder and small intestine.  During your HIDA scan  You'll be asked to change into a hospital gown before your HIDA scan begins. Your health care team will position you on a table, usually on your back. The radioactive tracer is then injected into a vein in your arm.The tracer travels through your bloodstream to your liver, where it's taken up by the bile-producing cells. The radioactive tracer travels with the bile from your liver into your gallbladder and through your bile ducts to  your small intestine.You may feel some pressure while the radioactive tracer is injected into your vein. As you lie on the table, a special gamma camera is positioned over your abdomen taking pictures of the tracer as it moves through your body. The gamma camera takes pictures continually for about an hour. You'll need to keep still during the HIDA scan. This can become uncomfortable, but you may find that you can lessen the discomfort by taking deep breaths and thinking about other things. Tell your health care team if you're uncomfortable. The radiologist will watch on a computer the progress of the radioactive tracer through your body. The HIDA scan may be stopped when the radioactive tracer is seen in the gallbladder and enters your small intestine. This typically takes about an hour. In some cases extra imaging will be performed if original images aren't satisfactory, if morphine is given to help visualize the gallbladder or if the medication CCK is given to look at the contraction of the gallbladder. This test typically takes 2 hours to complete. ________________________________________________________________________  Bonita QuinYou have been scheduled for an endoscopy. Please follow written instructions given to you at your visit today. If you use inhalers (even only as needed), please bring them with you on the day of your procedure. Your physician has requested that you go to www.startemmi.com and enter the access code given to you at your visit today. This web site gives a general overview about your procedure. However, you should still follow specific instructions given to you by our office regarding your preparation  for the procedure.  Continue Protonix 40 mg twice daily.  You have been given literature for Strict Anti-Reflux Regimen.  Thank you for choosing me and [facility].  Amy Esterwood, PA-C

## 2016-06-02 ENCOUNTER — Encounter: Payer: Self-pay | Admitting: Internal Medicine

## 2016-06-10 ENCOUNTER — Encounter: Payer: 59 | Admitting: Internal Medicine

## 2016-06-22 ENCOUNTER — Ambulatory Visit (HOSPITAL_COMMUNITY): Admission: RE | Admit: 2016-06-22 | Payer: 59 | Source: Ambulatory Visit

## 2017-08-23 ENCOUNTER — Ambulatory Visit: Payer: Self-pay | Admitting: Surgery

## 2017-08-23 NOTE — H&P (Signed)
William Hamburger Documented: 08/23/2017 9:41 AM Location: [facility] Patient #: [id number] DOB: Oct 07, 1970 Divorced / Language: [place] / Race: White Female  History of Present Illness Ardeth Sportsman MD; 08/23/2017 10:21 AM) The patient is a 47 year old female who presents for evaluation of gall stones. Note for "Gall stones": ` ` ` Patient sent for surgical consultation at the request of Dr Cliffton Asters  Chief Complaint: Marland Kitchen The patient is a pleasant active woman that's been struggling with some abdominal pains for over a year. Had some upper abdominal discomfort. Ultrasound done and showed some sludge and a fixed endoluminal mass most likely suspicious for a small 6 mm gallbladder polyp. She's had some symptoms of heartburn and reflux. She recalls being told she had I'll hernia based on an upper GI in the 1990s. She was sent for surgical consultation. Saw someone in [facility]. Surgeon not convinced biliary colic. Recommended considering seen gastroenterology first and if workup negative to reconsider cholecystectomy. She saw gastroenterology. I recommended 6 month ultrasound follow-up for stability of gallbladder polyp. Upper endoscopy. Started on Protonix twice a day. She took that for a few months. She noted she felt a little bit better and held off on any more aggressive workup. However she noted this year she's been having recurring symptoms. Pain is more chronic in the right upper abdomen now. Feels nauseated. He definitely feels like greasy foods like her sausage biscuit that she gets in the morning has set it off. She's adjust her diet to more low fat diet in breakfast bars and smaller protein snacks. Still struggles with nausea. She does have some heartburn and reflux. Usually over-the-counter medicines help take care of that. She did not like staying on the Protonix indefinitely so self weaned after few months. Stop drinking carbonated soda beverages. Denies  any dysphagia to solids or liquids but occasionally feels like pills or unchallenged to swallow. She is being get them down. She avoids nonsteroidals. Does not drink alcohol. She still smokes but is down to just a few a day and is trying to quit. She usually moves her bowels every day. Occasionally with stress or milk/ice cream she will have some gassiness and diarrhea. Not 2, now. She had a tubal ligation but no other abdominal surgeries. 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 No problems with exertional chest pain or shortness of breath. She definitely feels like the symptoms are triggered by eating now and feels worse. She is leaning toward considering surgery. She is worried that the gallbladder polyp might have gotten larger.  (Review of systems as stated in this history (HPI) or in the review of systems. Otherwise all other 12 point ROS are negative) ` ` `   Past Surgical History Maurilio Lovely; 08/23/2017 9:42 AM) No pertinent past surgical history  Diagnostic Studies History Maurilio Lovely; 08/23/2017 9:42 AM) Colonoscopy never Mammogram 1-3 years ago Pap Smear 1-5 years ago  Allergies Maurilio Lovely; 08/23/2017 9:44 AM) Penicillins Ambien *HYPNOTICS/SEDATIVES/SLEEP DISORDER AGENTS* HYDROcodone Bitartrate *CHEMICALS* OxyCODONE HCl *ANALGESICS - OPIOID*  Medication History Maurilio Lovely; 08/23/2017 9:44 AM) No Current Medications Medications Reconciled  Social History Maurilio Lovely; 08/23/2017 9:42 AM) Alcohol use Occasional alcohol use. Caffeine use Carbonated beverages, Coffee, Tea. No drug use Tobacco use Current every day smoker.  Family History Maurilio Lovely; 08/23/2017 9:42 AM) Cerebrovascular Accident Mother. Diabetes Mellitus Mother. Hypertension Mother. Seizure disorder Father. Thyroid problems Mother.  Pregnancy / Birth History Maurilio Lovely; 08/23/2017 9:42 AM) Age at  menarche 15 years. Contraceptive History Oral  contraceptives. Gravida 2 Length (months) of breastfeeding 3-6 Maternal age 48-25 Para 2 Regular periods  Other Problems Maurilio Lovely(Angela Holmes; 08/23/2017 9:42 AM) Anxiety Disorder Asthma Back Pain Depression Gastroesophageal Reflux Disease Lump In Breast Migraine Headache     Review of Systems Maurilio Lovely(Angela Holmes; 08/23/2017 9:42 AM) General Present- Weight Gain. Not Present- Appetite Loss, Chills, Fatigue, Fever, Night Sweats and Weight Loss. Skin Not Present- Change in Wart/Mole, Dryness, Hives, Jaundice, New Lesions, Non-Healing Wounds, Rash and Ulcer. HEENT Present- Seasonal Allergies. Not Present- Earache, Hearing Loss, Hoarseness, Nose Bleed, Oral Ulcers, Ringing in the Ears, Sinus Pain, Sore Throat, Visual Disturbances, Wears glasses/contact lenses and Yellow Eyes. Respiratory Present- Chronic Cough and Wheezing. Not Present- Bloody sputum, Difficulty Breathing and Snoring. Breast Present- Breast Mass. Not Present- Breast Pain, Nipple Discharge and Skin Changes. Cardiovascular Not Present- Chest Pain, Difficulty Breathing Lying Down, Leg Cramps, Palpitations, Rapid Heart Rate, Shortness of Breath and Swelling of Extremities. Gastrointestinal Present- Abdominal Pain and Nausea. Not Present- Bloating, Bloody Stool, Change in Bowel Habits, Chronic diarrhea, Constipation, Difficulty Swallowing, Excessive gas, Gets full quickly at meals, Hemorrhoids, Indigestion, Rectal Pain and Vomiting. Female Genitourinary Not Present- Frequency, Nocturia, Painful Urination, Pelvic Pain and Urgency. Musculoskeletal Not Present- Back Pain, Joint Pain, Joint Stiffness, Muscle Pain, Muscle Weakness and Swelling of Extremities. Neurological Not Present- Decreased Memory, Fainting, Headaches, Numbness, Seizures, Tingling, Tremor, Trouble walking and Weakness. Psychiatric Present- Anxiety. Not Present- Bipolar, Change in Sleep Pattern, Depression, Fearful and Frequent crying. Endocrine Not Present- Cold  Intolerance, Excessive Hunger, Hair Changes, Heat Intolerance, Hot flashes and New Diabetes. Hematology Not Present- Blood Thinners, Easy Bruising, Excessive bleeding, Gland problems, HIV and Persistent Infections.  Vitals Maurilio Lovely(Angela Holmes; 08/23/2017 9:45 AM) 08/23/2017 9:44 AM Weight: 185 lb Height: 69in Body Surface Area: 2 m Body Mass Index: 27.32 kg/m  Temp.: 98.78F(Oral)  Pulse: 77 (Regular)  BP: 132/84 (Sitting, Left Arm, Standard)      Physical Exam Ardeth Sportsman(Niamh Rada C. Joshia Kitchings MD; 08/23/2017 10:16 AM)  General Mental Status-Alert. General Appearance-Not in acute distress, Not Sickly. Orientation-Oriented X3. Hydration-Well hydrated. Voice-Normal.  Integumentary Global Assessment Upon inspection and palpation of skin surfaces of the - Axillae: non-tender, no inflammation or ulceration, no drainage. and Distribution of scalp and body hair is normal. General Characteristics Temperature - normal warmth is noted.  Head and Neck Head-normocephalic, atraumatic with no lesions or palpable masses. Face Global Assessment - atraumatic, no absence of expression. Neck Global Assessment - no abnormal movements, no bruit auscultated on the right, no bruit auscultated on the left, no decreased range of motion, non-tender. Trachea-midline. Thyroid Gland Characteristics - non-tender.  Eye Eyeball - Left-Extraocular movements intact, No Nystagmus. Eyeball - Right-Extraocular movements intact, No Nystagmus. Cornea - Left-No Hazy. Cornea - Right-No Hazy. Sclera/Conjunctiva - Left-No scleral icterus, No Discharge. Sclera/Conjunctiva - Right-No scleral icterus, No Discharge. Pupil - Left-Direct reaction to light normal. Pupil - Right-Direct reaction to light normal. Note: Wears glasses. Vision corrected  ENMT Ears Pinna - Left - no drainage observed, no generalized tenderness observed. Right - no drainage observed, no generalized tenderness  observed. Nose and Sinuses External Inspection of the Nose - no destructive lesion observed. Inspection of the nares - Left - quiet respiration. Right - quiet respiration. Mouth and Throat Lips - Upper Lip - no fissures observed, no pallor noted. Lower Lip - no fissures observed, no pallor noted. Nasopharynx - no discharge present. Oral Cavity/Oropharynx - Tongue - no dryness observed. Oral Mucosa - no cyanosis observed.  Hypopharynx - no evidence of airway distress observed.  Chest and Lung Exam Inspection Movements - Normal and Symmetrical. Accessory muscles - No use of accessory muscles in breathing. Palpation Palpation of the chest reveals - Non-tender. Auscultation Breath sounds - Normal and Clear.  Cardiovascular Auscultation Rhythm - Regular. Murmurs & Other Heart Sounds - Auscultation of the heart reveals - No Murmurs and No Systolic Clicks.  Abdomen Inspection Inspection of the abdomen reveals - No Visible peristalsis and No Abnormal pulsations. Umbilicus - No Bleeding, No Urine drainage. Palpation/Percussion Palpation and Percussion of the abdomen reveal - Soft, Non Tender, No Rebound tenderness, No Rigidity (guarding) and No Cutaneous hyperesthesia. Note: Mild right upper quadrant tenderness without palpation. Mild epigastric discomfort. Less so in the left upper side. No definite Murphy sign. No diastases. Central lower abdomen nontender. Abdomen soft. Not distended. No distasis recti. No umbilical or other anterior abdominal wall hernias  Female Genitourinary Sexual Maturity Tanner 5 - Adult hair pattern. Note: No vaginal bleeding nor discharge  Peripheral Vascular Upper Extremity Inspection - Left - No Cyanotic nailbeds, Not Ischemic. Right - No Cyanotic nailbeds, Not Ischemic.  Neurologic Neurologic evaluation reveals -normal attention span and ability to concentrate, able to name objects and repeat phrases. Appropriate fund of knowledge , normal sensation and  normal coordination. Mental Status Affect - not angry, not paranoid. Cranial Nerves-Normal Bilaterally. Gait-Normal.  Neuropsychiatric Mental status exam performed with findings of-able to articulate well with normal speech/language, rate, volume and coherence, thought content normal with ability to perform basic computations and apply abstract reasoning and no evidence of hallucinations, delusions, obsessions or homicidal/suicidal ideation.  Musculoskeletal Global Assessment Spine, Ribs and Pelvis - no instability, subluxation or laxity. Right Upper Extremity - no instability, subluxation or laxity.  Lymphatic Head & Neck  General Head & Neck Lymphatics: Bilateral - Description - No Localized lymphadenopathy. Axillary  General Axillary Region: Bilateral - Description - No Localized lymphadenopathy. Femoral & Inguinal  Generalized Femoral & Inguinal Lymphatics: Left - Description - No Localized lymphadenopathy. Right - Description - No Localized lymphadenopathy.    Assessment & Plan Ardeth Sportsman MD; 08/23/2017 10:15 AM)  GALL BLADDER POLYP (K82.4) Impression: Possible 6 mm gallbladder polyp versus stone. She is symptomatic from ileocolic now. I think she would benefit from cholecystectomy. She is ejection proceeding. The risks of the differential diagnosis seems underwhelming. I think she does have some component of heartburn and reflux but it does not seem severe and seems different.  I offered her to reconsider gastroenterology doing endoscopy, but her story is much more suspicious than initially suspected last year and I suspect she's got both issues going on. She would like to proceed with cholecystectomy first and then revisit the idea of seeing gastrology if needed.  She is concerned about having time off protected from work. I did caution that she will not be corrected immediately. At least 2 weeks before light duty and more likely 4-6 weeks before unrestricted  depending how things go.   CHRONIC CHOLECYSTITIS WITH CALCULUS (K80.10)  Current Plans You are being scheduled for surgery- Our schedulers will call you.  You should hear from our office's scheduling department within 5 working days about the location, date, and time of surgery. We try to make accommodations for patient's preferences in scheduling surgery, but sometimes the OR schedule or the surgeon's schedule prevents [place] from making those accommodations.  If you have not heard from our office [phone]) in 5 working days, call the office and ask  for your surgeon's nurse.  If you have other questions about your diagnosis, plan, or surgery, call the office and ask for your surgeon's nurse.  Written instructions provided Pt Education - Pamphlet Given - Laparoscopic Gallbladder Surgery: discussed with patient and provided information. The anatomy & physiology of hepatobiliary & pancreatic function was discussed. The pathophysiology of gallbladder dysfunction was discussed. Natural history risks without surgery was discussed. I feel the risks of no intervention will lead to serious problems that outweigh the operative risks; therefore, I recommended cholecystectomy to remove the pathology. I explained laparoscopic techniques with possible need for an open approach. Probable cholangiogram to evaluate the bilary tract was explained as well.  Risks such as bleeding, infection, abscess, leak, injury to other organs, need for further treatment, heart attack, death, and other risks were discussed. I noted a good likelihood this will help address the problem. Possibility that this will not correct all abdominal symptoms was explained. Goals of post-operative recovery were discussed as well. We will work to minimize complications. An educational handout further explaining the pathology and treatment options was given as well. Questions were answered. The patient expresses understanding &  wishes to proceed with surgery.  Pt Education - [facility] Laparosopic Post Op HCI (Meyer Arora) Pt Education - [facility] Good Bowel Health (Raiya Stainback) Pt Education - Laparoscopic Cholecystectomy: gallbladder  Ardeth Sportsman, MD, FACS, MASCRS Gastrointestinal and Minimally Invasive Surgery    [address] [place], [place] [address] [phone] Main / Paging [phone] Fax

## 2017-09-19 NOTE — Patient Instructions (Signed)
Teresa PeltonDee Ann Plummer  09/19/2017   Your procedure is scheduled on: 09-28-17   [facility]    Report to admitting at 9:00AM    Call this number if you have problems the morning of surgery [phone]     Remember: NO SOLID FOOD AFTER MIDNIGHT THE NIGHT PRIOR TO SURGERY. NOTHING BY MOUTH EXCEPT CLEAR LIQUIDS UNTIL 3 HOURS PRIOR TO SCHEULED SURGERY. PLEASE FINISH ENSURE DRINK PER SURGEON ORDER 3 HOURS PRIOR TO SCHEDULED SURGERY TIME WHICH NEEDS TO BE COMPLETED AT ____8:00am_____.   CLEAR LIQUID DIET   Foods Allowed                                                                     Foods Excluded  Coffee and tea, regular and decaf                             liquids that you cannot  Plain Jell-O in any flavor                                             see through such as: Fruit ices (not with fruit pulp)                                     milk, soups, orange juice  Iced Popsicles                                    All solid food Carbonated beverages, regular and diet                                    Cranberry, grape and apple juices Sports drinks like Gatorade Lightly seasoned clear broth or consume(fat free) Sugar, honey syrup  Sample Menu Breakfast                                Lunch                                     Supper Cranberry juice                    Beef broth                            Chicken broth Jell-O                                     Grape juice  Apple juice Coffee or tea                        Jell-O                                      Popsicle                                                Coffee or tea                        Coffee or tea  _____________________________________________________________________       Take these medicines the morning of surgery with A SIP OF WATER: none                                 You may not have any metal on your body including hair pins and               piercings  Do not wear jewelry, make-up, lotions, powders or perfumes, deodorant             Do not wear nail polish.  Do not shave  48 hours prior to surgery.     Do not bring valuables to the hospital. [facility] IS NOT             RESPONSIBLE   FOR VALUABLES.  Contacts, dentures or bridgework may not be worn into surgery.      Patients discharged the day of surgery will not be allowed to drive home.  Name and phone number of your driver:  Special Instructions: N/A              Please read over the following fact sheets you were given: _____________________________________________________________________             [facility] - Preparing for Surgery Before surgery, you can play an important role.  Because skin is not sterile, your skin needs to be as free of germs as possible.  You can reduce the number of germs on your skin by washing with CHG (chlorahexidine gluconate) soap before surgery.  CHG is an antiseptic cleaner which kills germs and bonds with the skin to continue killing germs even after washing. Please DO NOT use if you have an allergy to CHG or antibacterial soaps.  If your skin becomes reddened/irritated stop using the CHG and inform your nurse when you arrive at Short Stay. Do not shave (including legs and underarms) for at least 48 hours prior to the first CHG shower.  You may shave your face/neck. Please follow these instructions carefully:  1.  Shower with CHG Soap the night before surgery and the  morning of Surgery.  2.  If you choose to wash your hair, wash your hair first as usual with your  normal  shampoo.  3.  After you shampoo, rinse your hair and body thoroughly to remove the  shampoo.                           4.  Use CHG as you would any other liquid soap.  You can apply chg directly  to the skin and wash                       Gently with a scrungie or clean washcloth.  5.  Apply the CHG Soap to your body ONLY FROM THE NECK DOWN.   Do not  use on face/ open                           Wound or open sores. Avoid contact with eyes, ears mouth and genitals (private parts).                       Wash face,  Genitals (private parts) with your normal soap.             6.  Wash thoroughly, paying special attention to the area where your surgery  will be performed.  7.  Thoroughly rinse your body with warm water from the neck down.  8.  DO NOT shower/wash with your normal soap after using and rinsing off  the CHG Soap.                9.  Pat yourself dry with a clean towel.            10.  Wear clean pajamas.            11.  Place clean sheets on your bed the night of your first shower and do not  sleep with pets. Day of Surgery : Do not apply any lotions/deodorants the morning of surgery.  Please wear clean clothes to the hospital/surgery center.  FAILURE TO FOLLOW THESE INSTRUCTIONS MAY RESULT IN THE CANCELLATION OF YOUR SURGERY PATIENT SIGNATURE_________________________________  NURSE SIGNATURE__________________________________  ________________________________________________________________________

## 2017-09-20 ENCOUNTER — Encounter (HOSPITAL_COMMUNITY)
Admission: RE | Admit: 2017-09-20 | Discharge: 2017-09-20 | Disposition: A | Discharge status: Home / Self Care | Payer: 59 | Source: Ambulatory Visit | Attending: Surgery | Admitting: Surgery

## 2017-09-20 ENCOUNTER — Encounter (HOSPITAL_COMMUNITY): Payer: Self-pay

## 2017-09-20 ENCOUNTER — Other Ambulatory Visit: Payer: Self-pay

## 2017-09-20 DIAGNOSIS — I10 Essential (primary) hypertension: Secondary | ICD-10-CM | POA: Insufficient documentation

## 2017-09-20 DIAGNOSIS — Z01812 Encounter for preprocedural laboratory examination: Secondary | ICD-10-CM | POA: Insufficient documentation

## 2017-09-20 DIAGNOSIS — K819 Cholecystitis, unspecified: Secondary | ICD-10-CM | POA: Insufficient documentation

## 2017-09-20 DIAGNOSIS — Z01810 Encounter for preprocedural cardiovascular examination: Secondary | ICD-10-CM | POA: Insufficient documentation

## 2017-09-20 HISTORY — DX: Gestational diabetes mellitus in pregnancy, unspecified control: O24.419

## 2017-09-20 HISTORY — DX: Cholecystitis, unspecified: K81.9

## 2017-09-20 LAB — CBC
HCT: 44.2 % (ref 36.0–46.0)
HEMOGLOBIN: 14.8 g/dL (ref 12.0–15.0)
MCH: 29.9 pg (ref 26.0–34.0)
MCHC: 33.5 g/dL (ref 30.0–36.0)
MCV: 89.3 fL (ref 78.0–100.0)
Platelets: 215 10*3/uL (ref 150–400)
RBC: 4.95 MIL/uL (ref 3.87–5.11)
RDW: 13.5 % (ref 11.5–15.5)
WBC: 7.2 10*3/uL (ref 4.0–10.5)

## 2017-09-20 LAB — BASIC METABOLIC PANEL
Anion gap: 7 (ref 5–15)
BUN: 15 mg/dL (ref 6–20)
CHLORIDE: 107 mmol/L (ref 98–111)
CO2: 24 mmol/L (ref 22–32)
Calcium: 9.3 mg/dL (ref 8.9–10.3)
Creatinine, Ser: 0.64 mg/dL (ref 0.44–1.00)
GFR calc non Af Amer: >60 mL/min (ref >60)
Glucose, Bld: 135 mg/dL — ABNORMAL HIGH (ref 70–99)
Potassium: 4.3 mmol/L (ref 3.5–5.1)
SODIUM: 138 mmol/L (ref 135–145)

## 2017-09-20 LAB — HCG, SERUM, QUALITATIVE: Preg, Serum: NEGATIVE

## 2017-09-27 MED ORDER — BUPIVACAINE LIPOSOME 1.3 % IJ SUSP
20.0000 mL | INTRAMUSCULAR | Status: DC
  Filled 2017-09-27: qty 20

## 2017-09-28 ENCOUNTER — Ambulatory Visit (HOSPITAL_COMMUNITY): Payer: 59

## 2017-09-28 ENCOUNTER — Encounter (HOSPITAL_COMMUNITY): Payer: Self-pay | Admitting: Emergency Medicine

## 2017-09-28 ENCOUNTER — Ambulatory Visit (HOSPITAL_COMMUNITY): Payer: 59 | Admitting: Registered Nurse

## 2017-09-28 ENCOUNTER — Ambulatory Visit (HOSPITAL_COMMUNITY)
Admission: RE | Admit: 2017-09-28 | Discharge: 2017-09-28 | Disposition: A | Discharge status: Home / Self Care | Payer: 59 | Source: Ambulatory Visit | Attending: Surgery | Admitting: Surgery

## 2017-09-28 ENCOUNTER — Encounter (HOSPITAL_COMMUNITY)
Admission: RE | Disposition: A | Discharge status: Home / Self Care | Payer: Self-pay | Source: Ambulatory Visit | Attending: Surgery

## 2017-09-28 DIAGNOSIS — K219 Gastro-esophageal reflux disease without esophagitis: Secondary | ICD-10-CM | POA: Diagnosis not present

## 2017-09-28 DIAGNOSIS — F329 Major depressive disorder, single episode, unspecified: Secondary | ICD-10-CM | POA: Diagnosis not present

## 2017-09-28 DIAGNOSIS — K811 Chronic cholecystitis: Secondary | ICD-10-CM | POA: Insufficient documentation

## 2017-09-28 DIAGNOSIS — J45909 Unspecified asthma, uncomplicated: Secondary | ICD-10-CM | POA: Diagnosis not present

## 2017-09-28 DIAGNOSIS — Z79899 Other long term (current) drug therapy: Secondary | ICD-10-CM | POA: Insufficient documentation

## 2017-09-28 DIAGNOSIS — F17210 Nicotine dependence, cigarettes, uncomplicated: Secondary | ICD-10-CM | POA: Diagnosis not present

## 2017-09-28 DIAGNOSIS — I10 Essential (primary) hypertension: Secondary | ICD-10-CM | POA: Insufficient documentation

## 2017-09-28 DIAGNOSIS — F419 Anxiety disorder, unspecified: Secondary | ICD-10-CM | POA: Insufficient documentation

## 2017-09-28 DIAGNOSIS — Z419 Encounter for procedure for purposes other than remedying health state, unspecified: Secondary | ICD-10-CM

## 2017-09-28 DIAGNOSIS — K824 Cholesterolosis of gallbladder: Secondary | ICD-10-CM

## 2017-09-28 HISTORY — PX: LAPAROSCOPIC CHOLECYSTECTOMY SINGLE SITE WITH INTRAOPERATIVE CHOLANGIOGRAM: SHX6538

## 2017-09-28 SURGERY — LAPAROSCOPIC CHOLECYSTECTOMY SINGLE SITE WITH INTRAOPERATIVE CHOLANGIOGRAM
Anesthesia: General

## 2017-09-28 MED ORDER — SCOPOLAMINE 1 MG/3DAYS TD PT72
1.0000 | MEDICATED_PATCH | Freq: Once | TRANSDERMAL | Status: DC
  Administered 2017-09-28: 1.5 mg via TRANSDERMAL
  Filled 2017-09-28: qty 1

## 2017-09-28 MED ORDER — STERILE WATER FOR IRRIGATION IR SOLN
Status: DC | PRN
  Administered 2017-09-28: 1000 mL

## 2017-09-28 MED ORDER — FENTANYL CITRATE (PF) 100 MCG/2ML IJ SOLN
INTRAMUSCULAR | Status: AC
  Filled 2017-09-28: qty 2

## 2017-09-28 MED ORDER — PROPOFOL 10 MG/ML IV BOLUS
INTRAVENOUS | Status: DC | PRN
  Administered 2017-09-28: 150 mg via INTRAVENOUS

## 2017-09-28 MED ORDER — SUGAMMADEX SODIUM 200 MG/2ML IV SOLN
INTRAVENOUS | Status: AC
  Filled 2017-09-28: qty 2

## 2017-09-28 MED ORDER — BUPIVACAINE-EPINEPHRINE (PF) 0.5% -1:200000 IJ SOLN
INTRAMUSCULAR | Status: AC
  Filled 2017-09-28: qty 30

## 2017-09-28 MED ORDER — FENTANYL CITRATE (PF) 100 MCG/2ML IJ SOLN
INTRAMUSCULAR | Status: AC
  Administered 2017-09-28: 25 ug via INTRAVENOUS
  Filled 2017-09-28: qty 2

## 2017-09-28 MED ORDER — CELECOXIB 200 MG PO CAPS
200.0000 mg | ORAL_CAPSULE | ORAL | Status: AC
  Administered 2017-09-28: 200 mg via ORAL
  Filled 2017-09-28: qty 1

## 2017-09-28 MED ORDER — DEXAMETHASONE SODIUM PHOSPHATE 10 MG/ML IJ SOLN
INTRAMUSCULAR | Status: DC | PRN
  Administered 2017-09-28: 8 mg via INTRAVENOUS

## 2017-09-28 MED ORDER — MIDAZOLAM HCL 5 MG/5ML IJ SOLN
INTRAMUSCULAR | Status: DC | PRN
  Administered 2017-09-28: 2 mg via INTRAVENOUS

## 2017-09-28 MED ORDER — DEXAMETHASONE SODIUM PHOSPHATE 10 MG/ML IJ SOLN
INTRAMUSCULAR | Status: AC
  Filled 2017-09-28: qty 1

## 2017-09-28 MED ORDER — ACETAMINOPHEN 500 MG PO TABS
1000.0000 mg | ORAL_TABLET | ORAL | Status: AC
  Administered 2017-09-28: 1000 mg via ORAL
  Filled 2017-09-28: qty 2

## 2017-09-28 MED ORDER — ONDANSETRON HCL 4 MG/2ML IJ SOLN
INTRAMUSCULAR | Status: AC
  Filled 2017-09-28: qty 2

## 2017-09-28 MED ORDER — PROMETHAZINE HCL 25 MG/ML IJ SOLN: 6.2500 mg | INTRAMUSCULAR | Status: DC | PRN

## 2017-09-28 MED ORDER — LIDOCAINE 2% (20 MG/ML) 5 ML SYRINGE
INTRAMUSCULAR | Status: AC
  Filled 2017-09-28: qty 5

## 2017-09-28 MED ORDER — BUPIVACAINE-EPINEPHRINE (PF) 0.5% -1:200000 IJ SOLN
INTRAMUSCULAR | Status: DC | PRN
  Administered 2017-09-28: 30 mL

## 2017-09-28 MED ORDER — ROCURONIUM BROMIDE 10 MG/ML (PF) SYRINGE
PREFILLED_SYRINGE | INTRAVENOUS | Status: DC | PRN
  Administered 2017-09-28: 20 mg via INTRAVENOUS
  Administered 2017-09-28: 50 mg via INTRAVENOUS

## 2017-09-28 MED ORDER — IOPAMIDOL (ISOVUE-300) INJECTION 61%
INTRAVENOUS | Status: AC
  Filled 2017-09-28: qty 50

## 2017-09-28 MED ORDER — GABAPENTIN 300 MG PO CAPS
300.0000 mg | ORAL_CAPSULE | ORAL | Status: AC
  Administered 2017-09-28: 300 mg via ORAL
  Filled 2017-09-28: qty 1

## 2017-09-28 MED ORDER — CHLORHEXIDINE GLUCONATE CLOTH 2 % EX PADS: 6.0000 | MEDICATED_PAD | Freq: Once | CUTANEOUS | Status: DC

## 2017-09-28 MED ORDER — FENTANYL CITRATE (PF) 100 MCG/2ML IJ SOLN
INTRAMUSCULAR | Status: DC | PRN
  Administered 2017-09-28 (×3): 50 ug via INTRAVENOUS
  Administered 2017-09-28: 100 ug via INTRAVENOUS
  Administered 2017-09-28: 50 ug via INTRAVENOUS

## 2017-09-28 MED ORDER — LACTATED RINGERS IV SOLN
INTRAVENOUS | Status: DC
  Administered 2017-09-28: 10:00:00 via INTRAVENOUS

## 2017-09-28 MED ORDER — TRAMADOL HCL 50 MG PO TABS: 50.0000 mg | ORAL_TABLET | Freq: Four times a day (QID) | ORAL | 0 refills | Status: AC | PRN

## 2017-09-28 MED ORDER — BUPIVACAINE LIPOSOME 1.3 % IJ SUSP
INTRAMUSCULAR | Status: DC | PRN
  Administered 2017-09-28: 20 mL

## 2017-09-28 MED ORDER — ONDANSETRON HCL 4 MG/2ML IJ SOLN
INTRAMUSCULAR | Status: DC | PRN
  Administered 2017-09-28: 4 mg via INTRAVENOUS

## 2017-09-28 MED ORDER — 0.9 % SODIUM CHLORIDE (POUR BTL) OPTIME
TOPICAL | Status: DC | PRN
  Administered 2017-09-28: 1000 mL

## 2017-09-28 MED ORDER — LACTATED RINGERS IR SOLN
Status: DC | PRN
  Administered 2017-09-28: 1000 mL

## 2017-09-28 MED ORDER — SUGAMMADEX SODIUM 200 MG/2ML IV SOLN
INTRAVENOUS | Status: DC | PRN
  Administered 2017-09-28: 200 mg via INTRAVENOUS

## 2017-09-28 MED ORDER — IOPAMIDOL (ISOVUE-300) INJECTION 61%
INTRAVENOUS | Status: DC | PRN
  Administered 2017-09-28: 3.5 mL

## 2017-09-28 MED ORDER — MIDAZOLAM HCL 2 MG/2ML IJ SOLN
INTRAMUSCULAR | Status: AC
  Filled 2017-09-28: qty 2

## 2017-09-28 MED ORDER — ROCURONIUM BROMIDE 10 MG/ML (PF) SYRINGE
PREFILLED_SYRINGE | INTRAVENOUS | Status: AC
  Filled 2017-09-28: qty 10

## 2017-09-28 MED ORDER — FENTANYL CITRATE (PF) 100 MCG/2ML IJ SOLN
25.0000 ug | INTRAMUSCULAR | Status: DC | PRN
  Administered 2017-09-28 (×2): 25 ug via INTRAVENOUS

## 2017-09-28 MED ORDER — PROPOFOL 10 MG/ML IV BOLUS
INTRAVENOUS | Status: AC
  Filled 2017-09-28: qty 20

## 2017-09-28 MED ORDER — LIDOCAINE 2% (20 MG/ML) 5 ML SYRINGE
INTRAMUSCULAR | Status: DC | PRN
  Administered 2017-09-28: 80 mg via INTRAVENOUS

## 2017-09-28 MED ORDER — LIDOCAINE 2% (20 MG/ML) 5 ML SYRINGE
INTRAMUSCULAR | Status: DC | PRN
  Administered 2017-09-28: 1 mg/kg/h via INTRAVENOUS

## 2017-09-28 SURGICAL SUPPLY — 41 items
APPLIER CLIP 5 13 M/L LIGAMAX5 (MISCELLANEOUS) ×3
APR CLP MED LRG 5 ANG JAW (MISCELLANEOUS) ×1
BAG SPEC RTRVL 10 TROC 200 (ENDOMECHANICALS)
CABLE HIGH FREQUENCY MONO STRZ (ELECTRODE) ×3 IMPLANT
CHLORAPREP W/TINT 26ML (MISCELLANEOUS) ×3 IMPLANT
CLIP APPLIE 5 13 M/L LIGAMAX5 (MISCELLANEOUS) ×1 IMPLANT
COVER MAYO STAND STRL (DRAPES) ×3 IMPLANT
COVER SURGICAL LIGHT HANDLE (MISCELLANEOUS) ×3 IMPLANT
DECANTER SPIKE VIAL GLASS SM (MISCELLANEOUS) ×3 IMPLANT
DRAIN CHANNEL 19F RND (DRAIN) IMPLANT
DRAPE C-ARM 42X120 X-RAY (DRAPES) ×3 IMPLANT
DRAPE WARM FLUID 44X44 (DRAPE) ×3 IMPLANT
DRSG TEGADERM 4X4.75 (GAUZE/BANDAGES/DRESSINGS) ×3 IMPLANT
ELECT REM PT RETURN 15FT ADLT (MISCELLANEOUS) ×3 IMPLANT
ENDOLOOP SUT PDS II  0 18 (SUTURE)
ENDOLOOP SUT PDS II 0 18 (SUTURE) IMPLANT
EVACUATOR SILICONE 100CC (DRAIN) IMPLANT
GAUZE SPONGE 2X2 8PLY STRL LF (GAUZE/BANDAGES/DRESSINGS) ×1 IMPLANT
GLOVE ECLIPSE 8.0 STRL XLNG CF (GLOVE) ×3 IMPLANT
GLOVE INDICATOR 8.0 STRL GRN (GLOVE) ×3 IMPLANT
GOWN STRL REUS W/TWL XL LVL3 (GOWN DISPOSABLE) ×6 IMPLANT
IRRIG SUCT STRYKERFLOW 2 WTIP (MISCELLANEOUS) ×3
IRRIGATION SUCT STRKRFLW 2 WTP (MISCELLANEOUS) ×1 IMPLANT
KIT BASIN OR (CUSTOM PROCEDURE TRAY) ×3 IMPLANT
PAD POSITIONING PINK XL (MISCELLANEOUS) ×3 IMPLANT
POSITIONER SURGICAL ARM (MISCELLANEOUS) IMPLANT
POUCH RETRIEVAL ECOSAC 10 (ENDOMECHANICALS) IMPLANT
POUCH RETRIEVAL ECOSAC 10MM (ENDOMECHANICALS)
SCISSORS LAP 5X35 DISP (ENDOMECHANICALS) ×3 IMPLANT
SET CHOLANGIOGRAPH MIX (MISCELLANEOUS) ×3 IMPLANT
SHEARS HARMONIC ACE PLUS 36CM (ENDOMECHANICALS) ×3 IMPLANT
SPONGE GAUZE 2X2 STER 10/PKG (GAUZE/BANDAGES/DRESSINGS) ×2
SUT MNCRL AB 4-0 PS2 18 (SUTURE) ×3 IMPLANT
SUT PDS AB 1 CT1 27 (SUTURE) ×6 IMPLANT
SYR 20CC LL (SYRINGE) ×3 IMPLANT
TOWEL OR 17X26 10 PK STRL BLUE (TOWEL DISPOSABLE) ×3 IMPLANT
TOWEL OR NON WOVEN STRL DISP B (DISPOSABLE) ×3 IMPLANT
TRAY LAPAROSCOPIC (CUSTOM PROCEDURE TRAY) ×3 IMPLANT
TROCAR BLADELESS OPT 5 100 (ENDOMECHANICALS) ×3 IMPLANT
TROCAR BLADELESS OPT 5 150 (ENDOMECHANICALS) ×3 IMPLANT
TUBING INSUF HEATED (TUBING) ×3 IMPLANT

## 2017-09-28 NOTE — Transfer of Care (Signed)
Immediate Anesthesia Transfer of Care Note  Patient: Lacey Reyes  Procedure(s) Performed: LAPAROSCOPIC CHOLECYSTECTOMY SINGLE SITE WITH INTRAOPERATIVE CHOLANGIOGRAM (N/A )  Patient Location: PACU  Anesthesia Type:General  Level of Consciousness: awake, alert , oriented and patient cooperative  Airway & Oxygen Therapy: Patient Spontanous Breathing and Patient connected to face mask oxygen  Post-op Assessment: Report given to RN, Post -op Vital signs reviewed and stable and Patient moving all extremities  Post vital signs: Reviewed and stable  Last Vitals:  Vitals Value Taken Time  BP 141/80 09/28/2017  1:00 PM  Temp    Pulse 75 09/28/2017  1:02 PM  Resp 13 09/28/2017  1:02 PM  SpO2 100 % 09/28/2017  1:02 PM  Vitals shown include unvalidated device data.  Last Pain:  Vitals:   09/28/17 0927  TempSrc:   PainSc: 3       Patients Stated Pain Goal: 4 (09/28/17 5462)  Complications: No apparent anesthesia complications

## 2017-09-28 NOTE — Anesthesia Postprocedure Evaluation (Signed)
Anesthesia Post Note  Patient: Lacey Reyes  Procedure(s) Performed: LAPAROSCOPIC CHOLECYSTECTOMY SINGLE SITE WITH INTRAOPERATIVE CHOLANGIOGRAM (N/A )     Patient location during evaluation: PACU Anesthesia Type: General Level of consciousness: awake and alert, oriented and awake Pain management: pain level controlled Vital Signs Assessment: post-procedure vital signs reviewed and stable Respiratory status: spontaneous breathing, nonlabored ventilation and respiratory function stable Cardiovascular status: blood pressure returned to baseline and stable Postop Assessment: no apparent nausea or vomiting Anesthetic complications: no    Last Vitals:  Vitals:   09/28/17 1345 09/28/17 1355  BP: (!) 142/82 129/79  Pulse: (!) 58 63  Resp: 14 15  Temp: 36.9 C 36.6 C  SpO2: 99% 99%    Last Pain:  Vitals:   09/28/17 1355  TempSrc:   PainSc: 3                  Cecile Hearing

## 2017-09-28 NOTE — Discharge Instructions (Signed)
LAPAROSCOPIC SURGERY: POST OP INSTRUCTIONS ° °###################################################################### ° °EAT °Gradually transition to a high fiber diet with a fiber supplement over the next few weeks after discharge.  Start with a pureed / full liquid diet (see below) ° °WALK °Walk an hour a day.  Control your pain to do that.   ° °CONTROL PAIN °Control pain so that you can walk, sleep, tolerate sneezing/coughing, go up/down stairs. ° °HAVE A BOWEL MOVEMENT DAILY °Keep your bowels regular to avoid problems.  OK to try a laxative to override constipation.  OK to use an antidairrheal to slow down diarrhea.  Call if not better after 2 tries ° °CALL IF YOU HAVE PROBLEMS/CONCERNS °Call if you are still struggling despite following these instructions. °Call if you have concerns not answered by these instructions ° °###################################################################### ° ° ° °1. DIET: Follow a light bland diet the first 24 hours after arrival home, such as soup, liquids, crackers, etc.  Be sure to include lots of fluids daily.  Avoid fast food or heavy meals as your are more likely to get nauseated.  Eat a low fat the next few days after surgery.   °2. Take your usually prescribed home medications unless otherwise directed. °3. PAIN CONTROL: °a. Pain is best controlled by a usual combination of three different methods TOGETHER: °i. Ice/Heat °ii. Over the counter pain medication °iii. Prescription pain medication °b. Most patients will experience some swelling and bruising around the incisions.  Ice packs or heating pads (30-60 minutes up to 6 times a day) will help. Use ice for the first few days to help decrease swelling and bruising, then switch to heat to help relax tight/sore spots and speed recovery.  Some people prefer to use ice alone, heat alone, alternating between ice & heat.  Experiment to what works for you.  Swelling and bruising can take several weeks to resolve.   °c. It is  helpful to take an over-the-counter pain medication regularly for the first few weeks.  Choose one of the following that works best for you: °i. Naproxen (Aleve, etc)  Two 220mg tabs twice a day °ii. Ibuprofen (Advil, etc) Three 200mg tabs four times a day (every meal & bedtime) °iii. Acetaminophen (Tylenol, etc) 500-650mg four times a day (every meal & bedtime) °d. A  prescription for pain medication (such as oxycodone, hydrocodone, etc) should be given to you upon discharge.  Take your pain medication as prescribed.  °i. If you are having problems/concerns with the prescription medicine (does not control pain, nausea, vomiting, rash, itching, etc), please call us [phone] to see if we need to switch you to a different pain medicine that will work better for you and/or control your side effect better. °ii. If you need a refill on your pain medication, please contact your pharmacy.  They will contact our office to request authorization. Prescriptions will not be filled after 5 pm or on week-ends. °4. Avoid getting constipated.  Between the surgery and the pain medications, it is common to experience some constipation.  Increasing fluid intake and taking a fiber supplement (such as Metamucil, Citrucel, FiberCon, MiraLax, etc) 1-2 times a day regularly will usually help prevent this problem from occurring.  A mild laxative (prune juice, Milk of Magnesia, MiraLax, etc) should be taken according to package directions if there are no bowel movements after 48 hours.   °5. Watch out for diarrhea.  If you have many loose bowel movements, simplify your diet to bland foods & liquids for   a few days.  Stop any stool softeners and decrease your fiber supplement.  Switching to mild anti-diarrheal medications (Kayopectate, Pepto Bismol) can help.  If this worsens or does not improve, please call us. °6. Wash / shower every day.  You may shower over the dressings as they are waterproof.  Continue to shower over incision(s)  after the dressing is off. °7. Remove your waterproof bandages 5 days after surgery.  You may leave the incision open to air.  You may replace a dressing/Band-Aid to cover the incision for comfort if you wish.  °8. ACTIVITIES as tolerated:   °a. You may resume regular (light) daily activities beginning the next day--such as daily self-care, walking, climbing stairs--gradually increasing activities as tolerated.  If you can walk 30 minutes without difficulty, it is safe to try more intense activity such as jogging, treadmill, bicycling, low-impact aerobics, swimming, etc. °b. Save the most intensive and strenuous activity for last such as sit-ups, heavy lifting, contact sports, etc  Refrain from any heavy lifting or straining until you are off narcotics for pain control.   °c. DO NOT PUSH THROUGH PAIN.  Let pain be your guide: If it hurts to do something, don't do it.  Pain is your body warning you to avoid that activity for another week until the pain goes down. °d. You may drive when you are no longer taking prescription pain medication, you can comfortably wear a seatbelt, and you can safely maneuver your car and apply brakes. °e. You may have sexual intercourse when it is comfortable.  °9. FOLLOW UP in our office °a. Please call [facility] at [phone] to set up an appointment to see your surgeon in the office for a follow-up appointment approximately 2-3 weeks after your surgery. °b. Make sure that you call for this appointment the day you arrive home to insure a convenient appointment time. °10. IF YOU HAVE DISABILITY OR FAMILY LEAVE FORMS, BRING THEM TO THE OFFICE FOR PROCESSING.  DO NOT GIVE THEM TO YOUR DOCTOR. ° ° °WHEN TO CALL US [phone]: °1. Poor pain control °2. Reactions / problems with new medications (rash/itching, nausea, etc)  °3. Fever over 101.5 F (38.5 C) °4. Inability to urinate °5. Nausea and/or vomiting °6. Worsening swelling or bruising °7. Continued bleeding from incision. °8. Increased  pain, redness, or drainage from the incision ° ° The clinic staff is available to answer your questions during regular business hours (8:30am-5pm).  Please don’t hesitate to call and ask to speak to one of our nurses for clinical concerns.  ° If you have a medical emergency, go to the nearest emergency room or call 911. ° A surgeon from [facility] is always on call at the hospitals ° ° °[facility] °[address], [address], [place], [place]  [address] ? °MAIN: [phone] ? TOLL FREE: [phone] ?  °FAX [phone] °[email or web address] ° ° °

## 2017-09-28 NOTE — Anesthesia Procedure Notes (Signed)
Procedure Name: Intubation Date/Time: 09/28/2017 11:33 AM Performed by: Victoriano Lain, CRNA Pre-anesthesia Checklist: Patient identified, Emergency Drugs available, Suction available, Patient being monitored and Timeout performed Patient Re-evaluated:Patient Re-evaluated prior to induction Oxygen Delivery Method: Circle system utilized Preoxygenation: Pre-oxygenation with 100% oxygen Induction Type: IV induction Ventilation: Mask ventilation without difficulty Laryngoscope Size: Mac and 4 Grade View: Grade II Tube type: Oral Tube size: 7.5 mm Number of attempts: 1 Airway Equipment and Method: Stylet Placement Confirmation: ETT inserted through vocal cords under direct vision,  positive ETCO2 and breath sounds checked- equal and bilateral Secured at: 23 cm Tube secured with: Tape Dental Injury: Teeth and Oropharynx as per pre-operative assessment

## 2017-09-28 NOTE — H&P (Signed)
Lacey Reyes DOB: 1970/03/17 Divorced / Language: [place] / Race: White  Patient sent for surgical consultation at the request of Dr Cliffton Asters  Chief Complaint: Marland Kitchen The patient is a pleasant active woman that's been struggling with some abdominal pains for over a year. Had some upper abdominal discomfort. Ultrasound done and showed some sludge and a fixed endoluminal mass most likely suspicious for a small 6 mm gallbladder polyp. She's had some symptoms of heartburn and reflux. She recalls being told she had I'll hernia based on an upper GI in the 1990s. She was sent for surgical consultation. Saw someone in [facility]. Surgeon not convinced biliary colic. Recommended considering seen gastroenterology first and if workup negative to reconsider cholecystectomy. She saw gastroenterology. I recommended 6 month ultrasound follow-up for stability of gallbladder polyp. Upper endoscopy. Started on Protonix twice a day. She took that for a few months. She noted she felt a little bit better and held off on any more aggressive workup.   However she noted this year she's been having recurring symptoms. Pain is more chronic in the right upper abdomen now. Feels nauseated. He definitely feels like greasy foods like her sausage biscuit that she gets in the morning has set it off. She's adjust her diet to more low fat diet in breakfast bars and smaller protein snacks. Still struggles with nausea. She does have some heartburn and reflux. Usually over-the-counter medicines help take care of that. She did not like staying on the Protonix indefinitely so self weaned after few months. Stop drinking carbonated soda beverages. Denies any dysphagia to solids or liquids but occasionally feels like pills or unchallenged to swallow. She is being get them down. She avoids nonsteroidals. Does not drink alcohol. She still smokes but is down to just a few a day and is trying to quit. She usually moves  her bowels every day. Occasionally with stress or milk/ice cream she will have some gassiness and diarrhea. Not 2, now. She had a tubal ligation but no other abdominal surgeries. 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 No problems with exertional chest pain or shortness of breath.   She definitely feels like the symptoms are triggered by eating now and feels worse. She is leaning toward considering surgery. She is worried that the gallbladder polyp might have gotten larger.  Ready to consider cholecystectomy    (Review of systems as stated in this history (HPI) or in the review of systems. Otherwise all other 12 point ROS are negative) ` ` `   Past Surgical History Maurilio Lovely; 08/23/2017 9:42 AM) No pertinent past surgical history  Diagnostic Studies History Maurilio Lovely; 08/23/2017 9:42 AM) Colonoscopy never Mammogram 1-3 years ago Pap Smear 1-5 years ago  Allergies Maurilio Lovely; 08/23/2017 9:44 AM) Penicillins Ambien *HYPNOTICS/SEDATIVES/SLEEP DISORDER AGENTS* HYDROcodone Bitartrate *CHEMICALS* OxyCODONE HCl *ANALGESICS - OPIOID*  Medication History Maurilio Lovely; 08/23/2017 9:44 AM) No Current Medications Medications Reconciled  Social History Maurilio Lovely; 08/23/2017 9:42 AM) Alcohol use Occasional alcohol use. Caffeine use Carbonated beverages, Coffee, Tea. No drug use Tobacco use Current every day smoker.  Family History Maurilio Lovely; 08/23/2017 9:42 AM) Cerebrovascular Accident Mother. Diabetes Mellitus Mother. Hypertension Mother. Seizure disorder Father. Thyroid problems Mother.  Pregnancy / Birth History Maurilio Lovely; 08/23/2017 9:42 AM) Age at menarche 15 years. Contraceptive History Oral contraceptives. Gravida 2 Length (months) of breastfeeding 3-6 Maternal age 19-25 Para 2 Regular periods  Other Problems Maurilio Lovely; 08/23/2017 9:42 AM) Anxiety Disorder Asthma Back  Pain Depression Gastroesophageal Reflux Disease Lump In Breast Migraine Headache     Review of Systems Maurilio Lovely; 08/23/2017 9:42 AM) General Present- Weight Gain. Not Present- Appetite Loss, Chills, Fatigue, Fever, Night Sweats and Weight Loss. Skin Not Present- Change in Wart/Mole, Dryness, Hives, Jaundice, New Lesions, Non-Healing Wounds, Rash and Ulcer. HEENT Present- Seasonal Allergies. Not Present- Earache, Hearing Loss, Hoarseness, Nose Bleed, Oral Ulcers, Ringing in the Ears, Sinus Pain, Sore Throat, Visual Disturbances, Wears glasses/contact lenses and Yellow Eyes. Respiratory Present- Chronic Cough and Wheezing. Not Present- Bloody sputum, Difficulty Breathing and Snoring. Breast Present- Breast Mass. Not Present- Breast Pain, Nipple Discharge and Skin Changes. Cardiovascular Not Present- Chest Pain, Difficulty Breathing Lying Down, Leg Cramps, Palpitations, Rapid Heart Rate, Shortness of Breath and Swelling of Extremities. Gastrointestinal Present- Abdominal Pain and Nausea. Not Present- Bloating, Bloody Stool, Change in Bowel Habits, Chronic diarrhea, Constipation, Difficulty Swallowing, Excessive gas, Gets full quickly at meals, Hemorrhoids, Indigestion, Rectal Pain and Vomiting. Female Genitourinary Not Present- Frequency, Nocturia, Painful Urination, Pelvic Pain and Urgency. Musculoskeletal Not Present- Back Pain, Joint Pain, Joint Stiffness, Muscle Pain, Muscle Weakness and Swelling of Extremities. Neurological Not Present- Decreased Memory, Fainting, Headaches, Numbness, Seizures, Tingling, Tremor, Trouble walking and Weakness. Psychiatric Present- Anxiety. Not Present- Bipolar, Change in Sleep Pattern, Depression, Fearful and Frequent crying. Endocrine Not Present- Cold Intolerance, Excessive Hunger, Hair Changes, Heat Intolerance, Hot flashes and New Diabetes. Hematology Not Present- Blood Thinners, Easy Bruising, Excessive bleeding, Gland problems, HIV and  Persistent Infections.  Vitals Maurilio Lovely; 08/23/2017 9:45 AM) 08/23/2017 9:44 AM Weight: 185 lb Height: 69in Body Surface Area: 2 m Body Mass Index: 27.32 kg/m  Temp.: 98.85F(Oral)  Pulse: 77 (Regular)  BP: 132/84 (Sitting, Left Arm, Standard)   BP 127/89   Pulse 73   Temp 98.9 F (37.2 C) (Oral)   Resp 16   Ht 5\' 9"  (1.753 m)   Wt 83.5 kg   LMP 09/22/2017 (Exact Date)   SpO2 98%   BMI 27.17 kg/m     Physical Exam Ardeth Sportsman MD; 08/23/2017 10:16 AM)  General Mental Status-Alert. General Appearance-Not in acute distress, Not Sickly. Orientation-Oriented X3. Hydration-Well hydrated. Voice-Normal.  Integumentary Global Assessment Upon inspection and palpation of skin surfaces of the - Axillae: non-tender, no inflammation or ulceration, no drainage. and Distribution of scalp and body hair is normal. General Characteristics Temperature - normal warmth is noted.  Head and Neck Head-normocephalic, atraumatic with no lesions or palpable masses. Face Global Assessment - atraumatic, no absence of expression. Neck Global Assessment - no abnormal movements, no bruit auscultated on the right, no bruit auscultated on the left, no decreased range of motion, non-tender. Trachea-midline. Thyroid Gland Characteristics - non-tender.  Eye Eyeball - Left-Extraocular movements intact, No Nystagmus. Eyeball - Right-Extraocular movements intact, No Nystagmus. Cornea - Left-No Hazy. Cornea - Right-No Hazy. Sclera/Conjunctiva - Left-No scleral icterus, No Discharge. Sclera/Conjunctiva - Right-No scleral icterus, No Discharge. Pupil - Left-Direct reaction to light normal. Pupil - Right-Direct reaction to light normal. Note: Wears glasses. Vision corrected  ENMT Ears Pinna - Left - no drainage observed, no generalized tenderness observed. Right - no drainage observed, no generalized tenderness observed. Nose and  Sinuses External Inspection of the Nose - no destructive lesion observed. Inspection of the nares - Left - quiet respiration. Right - quiet respiration. Mouth and Throat Lips - Upper Lip - no fissures observed, no pallor noted. Lower Lip - no fissures observed, no pallor noted. Nasopharynx - no discharge present. Oral Cavity/Oropharynx -  Tongue - no dryness observed. Oral Mucosa - no cyanosis observed. Hypopharynx - no evidence of airway distress observed.  Chest and Lung Exam Inspection Movements - Normal and Symmetrical. Accessory muscles - No use of accessory muscles in breathing. Palpation Palpation of the chest reveals - Non-tender. Auscultation Breath sounds - Normal and Clear.  Cardiovascular Auscultation Rhythm - Regular. Murmurs & Other Heart Sounds - Auscultation of the heart reveals - No Murmurs and No Systolic Clicks.  Abdomen Inspection Inspection of the abdomen reveals - No Visible peristalsis and No Abnormal pulsations. Umbilicus - No Bleeding, No Urine drainage. Palpation/Percussion Palpation and Percussion of the abdomen reveal - Soft, Non Tender, No Rebound tenderness, No Rigidity (guarding) and No Cutaneous hyperesthesia. Note: Mild right upper quadrant tenderness without palpation. Mild epigastric discomfort. Less so in the left upper side. No definite Murphy sign. No diastases. Central lower abdomen nontender. Abdomen soft. Not distended. No distasis recti. No umbilical or other anterior abdominal wall hernias  Female Genitourinary Sexual Maturity Tanner 5 - Adult hair pattern. Note: No vaginal bleeding nor discharge  Peripheral Vascular Upper Extremity Inspection - Left - No Cyanotic nailbeds, Not Ischemic. Right - No Cyanotic nailbeds, Not Ischemic.  Neurologic Neurologic evaluation reveals -normal attention span and ability to concentrate, able to name objects and repeat phrases. Appropriate fund of knowledge , normal sensation and normal  coordination. Mental Status Affect - not angry, not paranoid. Cranial Nerves-Normal Bilaterally. Gait-Normal.  Neuropsychiatric Mental status exam performed with findings of-able to articulate well with normal speech/language, rate, volume and coherence, thought content normal with ability to perform basic computations and apply abstract reasoning and no evidence of hallucinations, delusions, obsessions or homicidal/suicidal ideation.  Musculoskeletal Global Assessment Spine, Ribs and Pelvis - no instability, subluxation or laxity. Right Upper Extremity - no instability, subluxation or laxity.  Lymphatic Head & Neck  General Head & Neck Lymphatics: Bilateral - Description - No Localized lymphadenopathy. Axillary  General Axillary Region: Bilateral - Description - No Localized lymphadenopathy. Femoral & Inguinal  Generalized Femoral & Inguinal Lymphatics: Left - Description - No Localized lymphadenopathy. Right - Description - No Localized lymphadenopathy.    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 She is symptomatic from ileocolic now. I think she would benefit from cholecystectomy. She is ejection proceeding. The risks of the differential diagnosis seems underwhelming. I think she does have some component of heartburn and reflux but it does not seem severe and seems different.  She is concerned about having time off protected from work. I did caution that she will not be unrestricted activity immediately. At least 2 weeks before light duty and more likely 4-6 weeks before unrestricted depending how things go.  The anatomy & physiology of hepatobiliary & pancreatic function was discussed. The pathophysiology of gallbladder dysfunction was discussed. Natural history risks without surgery was discussed. I feel the risks of no intervention will lead to serious problems  that outweigh the operative risks; therefore, I recommended cholecystectomy to remove the pathology. I explained laparoscopic techniques with possible need for an open approach. Probable cholangiogram to evaluate the bilary tract was explained as well.  Risks such as bleeding, infection, abscess, leak, injury to other organs, need for further treatment, heart attack, death, and other risks were discussed. I noted a good likelihood this will help address the problem. Possibility that this will not correct all abdominal symptoms was explained. Goals of post-operative recovery were discussed  as well. We will work to minimize complications. An educational handout further explaining the pathology and treatment options was given as well. Questions were answered. The patient expresses understanding & wishes to proceed with surgery.    Ardeth Sportsman, MD, FACS, MASCRS Gastrointestinal and Minimally Invasive Surgery    [address], [address] [place], [place] [address] [phone] Main / Paging [phone] Fax

## 2017-09-28 NOTE — Anesthesia Preprocedure Evaluation (Addendum)
Anesthesia Evaluation  Patient identified by MRN, date of birth, ID band Patient awake    Reviewed: Allergy & Precautions, NPO status , Patient's Chart, lab work & pertinent test results  Airway Mallampati: II  TM Distance: <3 FB Neck ROM: Full    Dental  (+) Teeth Intact, Dental Advisory Given, Caps, Chipped,    Pulmonary asthma , Current Smoker,    Pulmonary exam normal breath sounds clear to auscultation       Cardiovascular hypertension, Normal cardiovascular exam Rhythm:Regular Rate:Normal     Neuro/Psych PSYCHIATRIC DISORDERS Anxiety Depression negative neurological ROS     GI/Hepatic negative GI ROS, GERD  Medicated,SYMPTOMATIC BILIARY COLIC PROBABLE CHRONIC CHOLECYSTITIS   Endo/Other    Renal/GU negative Renal ROS     Musculoskeletal negative musculoskeletal ROS (+)   Abdominal   Peds  Hematology negative hematology ROS (+)   Anesthesia Other Findings Day of surgery medications reviewed with the patient.  Reproductive/Obstetrics                            Anesthesia Physical Anesthesia Plan  ASA: II  Anesthesia Plan: General   Post-op Pain Management:    Induction: Intravenous  PONV Risk Score and Plan: 4 or greater and Scopolamine patch - Pre-op, Midazolam, Dexamethasone and Ondansetron  Airway Management Planned: Oral ETT  Additional Equipment:   Intra-op Plan:   Post-operative Plan: Extubation in OR  Informed Consent: I have reviewed the patients History and Physical, chart, labs and discussed the procedure including the risks, benefits and alternatives for the proposed anesthesia with the patient or authorized representative who has indicated his/her understanding and acceptance.   Dental advisory given  Plan Discussed with: CRNA  Anesthesia Plan Comments:         Anesthesia Quick Evaluation

## 2017-09-28 NOTE — Op Note (Signed)
09/28/2017  PATIENT:  Lacey Reyes  47 y.o. female  Patient Care Team: April Manson, NP as PCP - General (Family Medicine) Karie Soda, MD as Consulting Physician (General Surgery) Pyrtle, Carie Caddy, MD as Consulting Physician (Gastroenterology)  PRE-OPERATIVE DIAGNOSIS:    Chronic Cholecystitis  POST-OPERATIVE DIAGNOSIS:   Chronic Cholecystitis  Liver: normal  PROCEDURE:  SINGLE SITE Laparoscopic cholecystectomy with intraoperative cholangiogram  SURGEON:  Ardeth Sportsman, MD, FACS.  ASSISTANT:  Garner Nash, PA-S, [employer]  ANESTHESIA:    General with endotracheal intubation Local anesthetic as a field block  EBL:  (See Anesthesia Intraoperative Record) Total I/O In: -  Out: 25 [Blood:25]  Delay start of Pharmacological VTE agent (>24hrs) due to surgical blood loss or risk of bleeding:  no  DRAINS: None   SPECIMEN: Gallbladder    DISPOSITION OF SPECIMEN:  PATHOLOGY  COUNTS:  YES  PLAN OF CARE: Discharge to home after PACU  PATIENT DISPOSITION:  PACU - hemodynamically stable.  INDICATION: Pleasant woman with upper abdominal pain and history of intraluminal gallbladder mass most likely a polyp.  Perhaps getting larger.  Persistent symptoms that are it became more suspicious for biliary colic.  The rest of the differential diagnosis seems unlikely.  I offered cholecystectomy.  The anatomy & physiology of hepatobiliary & pancreatic function was discussed.  The pathophysiology of gallbladder dysfunction was discussed.  Natural history risks without surgery was discussed.   I feel the risks of no intervention will lead to serious problems that outweigh the operative risks; therefore, I recommended cholecystectomy to remove the pathology.  I explained laparoscopic techniques with possible need for an open approach.  Probable cholangiogram to evaluate the bilary tract was explained as well.    Risks such as bleeding, infection, abscess, leak, injury to other  organs, need for further treatment, heart attack, death, and other risks were discussed.  I noted a good likelihood this will help address the problem.  Possibility that this will not correct all abdominal symptoms was explained.  Goals of post-operative recovery were discussed as well.  We will work to minimize complications.  An educational handout further explaining the pathology and treatment options was given as well.  Questions were answered.  The patient expresses understanding & wishes to proceed with surgery.  OR FINDINGS: Mild gallbladder wall changes and thickening suspicious for chronic cholecystitis.  Intraoperative claims gram shows no choledocholithiasis.  Rather classic biliary anatomy.  No leak or other concern.  Liver: normal  DESCRIPTION:   The patient was identified & brought in the operating room. The patient was positioned supine with arms tucked. SCDs were active during the entire case. The patient underwent general anesthesia without any difficulty.  The abdomen was prepped and draped in a sterile fashion. A Surgical Timeout confirmed our plan.  I made a transverse curvilinear incision through the superior umbilical fold.  I placed a 5mm long port through the supraumbilical fascia using a modified Hassan cutdown technique with umbilical stalk fascial countertraction. I began carbon dioxide insufflation.  No change in end tidal CO2 measurement.   Camera inspection revealed no injury. There were no adhesions to the anterior abdominal wall supraumbilically.  I proceeded to continue with single site technique. I placed a #5 port in left upper aspect of the wound. I placed a 5 mm atraumatic grasper in the right inferior aspect of the wound.  I turned attention to the right upper quadrant.  Gallbladder was distended with some mild thickening consistent with  some chronic cholecystitis.  No gangrene.  No major adhesions.  I freed the peritoneal coverings between the gallbladder and the  liver on the posteriolateral and anteriomedial walls. I alternated between Harmonic & blunt Maryland dissection to help get a good critical view of the cystic artery and cystic duct.  did further dissection to free 50%of the gallbladder off the liver bed to get a good critical view of the infundibulum and cystic duct. I dissected out the cystic artery; and, after getting a good 360 view, ligated the anterior & posterior branches of the cystic artery close on the infundibulum using the Harmonic ultrasonic dissection.  I skeletonized the cystic duct.  I placed a clip on the infundibulum. I did a partial cystic duct-otomy and ensured patency. I placed a 5 [place] cholangiocatheter through a puncture site at the right subcostal ridge of the abdominal wall and directed it into the cystic duct.  We ran a cholangiogram with dilute radio-opaque contrast and continuous fluoroscopy. Contrast flowed from a side branch consistent with cystic duct cannulization. Contrast flowed up the common hepatic duct into the right and left intrahepatic chains out to secondary radicals. Contrast flowed down the common bile duct easily across the normal ampulla into the duodenum.  This was consistent with a normal cholangiogram.  I removed the cholangiocatheter. I placed clips on the cystic duct x4.  I completed cystic duct transection. I freed the gallbladder from its remaining attachments to the liver. I ensured hemostasis on the gallbladder fossa of the liver and elsewhere. I inspected the rest of the abdomen & detected no injury nor bleeding elsewhere.  I removed the gallbladder out the supraumbilical fascia. I closed the fascia transversely using #1 PDS interrupted stitches. I closed the skin using 4-0 monocryl stitch.  Sterile dressing was applied. The patient was extubated & arrived in the PACU in stable condition..  I had discussed postoperative care with the patient in the holding area. I discussed operative findings, updated  the patient's status, discussed probable steps to recovery, and gave postoperative recommendations to the patient's family.  Recommendations were made.  Questions were answered.  They expressed understanding & appreciation.  Ardeth Sportsman, M.D., F.A.C.S. Gastrointestinal and Minimally Invasive Surgery [facility] [address], [address] [place], [place] [address] [phone] Main / Paging  09/28/2017 12:55 PM

## 2017-09-28 NOTE — Interval H&P Note (Signed)
History and Physical Interval Note:  09/28/2017 10:39 AM  Lacey Reyes  has presented today for surgery, with the diagnosis of SYMPTOMATIC BILIARY COLIC PROBABLE CHRONIC CHOLECYSTITIS  The various methods of treatment have been discussed with the patient and family. After consideration of risks, benefits and other options for treatment, the patient has consented to  Procedure(s): LAPAROSCOPIC CHOLECYSTECTOMY SINGLE SITE WITH INTRAOPERATIVE CHOLANGIOGRAM (N/A) as a surgical intervention .  The patient's history has been reviewed, patient examined, no change in status, stable for surgery.  I have reviewed the patient's chart and labs.  Questions were answered to the patient's satisfaction.    I have re-reviewed the the patient's records, history, medications, and allergies.  I have re-examined the patient.  I again discussed intraoperative plans and goals of post-operative recovery.  The patient agrees to proceed.  Lacey Reyes  July 01, 1970 [id number]  Patient Care Team: April Manson, NP as PCP - General (Family Medicine) Karie Soda, MD as Consulting Physician (General Surgery) Pyrtle, Carie Caddy, MD as Consulting Physician (Gastroenterology)  Patient Active Problem List   Diagnosis Date Noted  . DYSURIA 12/05/2009    Past Medical History:  Diagnosis Date  . Anxiety   . Asthma   . Cholecystitis   . Depression   . Gallbladder polyp   . Gestational diabetes   . Hypertension   . Seasonal allergies     Past Surgical History:  Procedure Laterality Date  . RHINOPLASTY    . TUBAL LIGATION      Social History   Socioeconomic History  . Marital status: Legally Separated    Spouse name: Not on file  . Number of children: Not on file  . Years of education: Not on file  . Highest education level: Not on file  Occupational History  . Not on file  Social Needs  . Financial resource strain: Not on file  . Food insecurity:    Worry: Not on file    Inability: Not on file  .  Transportation needs:    Medical: Not on file    Non-medical: Not on file  Tobacco Use  . Smoking status: Current Every Day Smoker    Packs/day: 0.50    Years: 20.00    Pack years: 10.00    Types: Cigarettes  . Smokeless tobacco: Never Used  Substance and Sexual Activity  . Alcohol use: No  . Drug use: No  . Sexual activity: Not on file  Lifestyle  . Physical activity:    Days per week: Not on file    Minutes per session: Not on file  . Stress: Not on file  Relationships  . Social connections:    Talks on phone: Not on file    Gets together: Not on file    Attends religious service: Not on file    Active member of club or organization: Not on file    Attends meetings of clubs or organizations: Not on file    Relationship status: Not on file  . Intimate partner violence:    Fear of current or ex partner: Not on file    Emotionally abused: Not on file    Physically abused: Not on file    Forced sexual activity: Not on file  Other Topics Concern  . Not on file  Social History Narrative  . Not on file    Family History  Problem Relation Age of Onset  . Hypertension Mother   . Diabetes Mother   . Colon  polyps Mother   . Seizures Father   . Hypertension Father   . Breast cancer Maternal Aunt   . Lung cancer Maternal Grandfather   . Ovarian cancer Maternal Aunt   . Colon cancer Neg Hx     Medications Prior to Admission  Medication Sig Dispense Refill Last Dose  . acetaminophen (TYLENOL) 500 MG tablet Take 1,000 mg by mouth 2 (two) times daily as needed for moderate pain or headache.   Past Week at Unknown time  . diphenhydrAMINE (BENADRYL) 25 MG tablet Take 25 mg by mouth every 6 (six) hours as needed for itching or sleep.   09/27/2017 at Unknown time  . levocetirizine (XYZAL) 5 MG tablet Take 5 mg by mouth at bedtime.   09/27/2017 at Unknown time  . ranitidine (ZANTAC) 75 MG tablet Take 150 mg by mouth daily as needed for heartburn.   Past Week at Unknown time     Current Facility-Administered Medications  Medication Dose Route Frequency Provider Last Rate Last Dose  . bupivacaine liposome (EXPAREL) 1.3 % injection 266 mg  20 mL Infiltration On Call to OR Karie Soda, MD      . Chlorhexidine Gluconate Cloth 2 % PADS 6 each  6 each Topical Once Karie Soda, MD       And  . Chlorhexidine Gluconate Cloth 2 % PADS 6 each  6 each Topical Once Karie Soda, MD      . lactated ringers infusion   Intravenous Continuous Eilene Ghazi, MD 50 mL/hr at 09/28/17 [phone]    . scopolamine (TRANSDERM-SCOP) 1 MG/3DAYS 1.5 mg  1 patch Transdermal Once Cecile Hearing, MD   1.5 mg at 09/28/17 1030     Allergies  Allergen Reactions  . Penicillins Anaphylaxis    Has patient had a PCN reaction causing immediate rash, facial/tongue/throat swelling, SOB or lightheadedness with hypotension: Yes Has patient had a PCN reaction causing severe rash involving mucus membranes or skin necrosis: No Has patient had a PCN reaction that required hospitalization: Unknown Has patient had a PCN reaction occurring within the last 10 years: No If all of the above answers are "NO", then may proceed with Cephalosporin use.   . Adhesive [Tape] Itching    blister  . Ambien [Zolpidem Tartrate] Itching  . Hydrocodone Itching  . Other     Grass, trees, environmental, dogs, cats, dust mites, roaches  Dates-vomiting   . Oxycodone Itching    BP 127/89   Pulse 73   Temp 98.9 F (37.2 C) (Oral)   Resp 16   Ht 5\' 9"  (1.753 m)   Wt 83.5 kg   LMP 09/22/2017 (Exact Date)   SpO2 98%   BMI 27.17 kg/m   Labs: No results found for this or any previous visit (from the past 48 hour(s)).  Imaging / Studies: No results found.   Ardeth Sportsman, M.D., F.A.C.S. Gastrointestinal and Minimally Invasive Surgery [facility] [address] [place], [place] [address] [phone] Main / Paging  09/28/2017 10:39 AM    Ardeth Sportsman

## 2017-09-29 ENCOUNTER — Encounter (HOSPITAL_COMMUNITY): Payer: Self-pay | Admitting: Surgery

## 2019-11-20 IMAGING — RF DG CHOLANGIOGRAM OPERATIVE
1 series · 4 of 4 positions shown · non-contrast
Comparison: CT abdomen pelvis - 09/29/2016

CLINICAL DATA: Intraoperative cholangiogram during laparoscopic
cholecystectomy.

EXAM:
INTRAOPERATIVE CHOLANGIOGRAM
FLUOROSCOPY TIME:  9 seconds

[Series 1: run · 4 of 46 frames shown]
[frame 7/46]
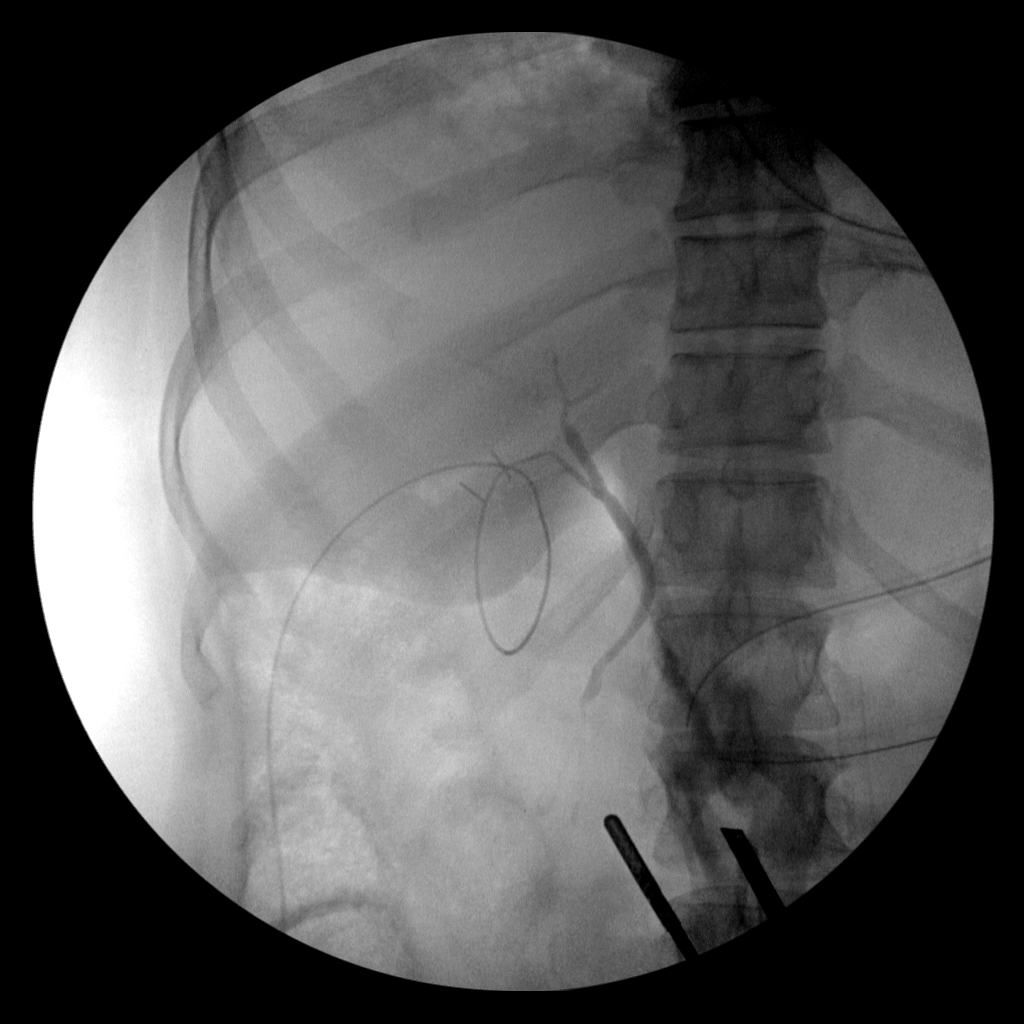
[frame 24/46]
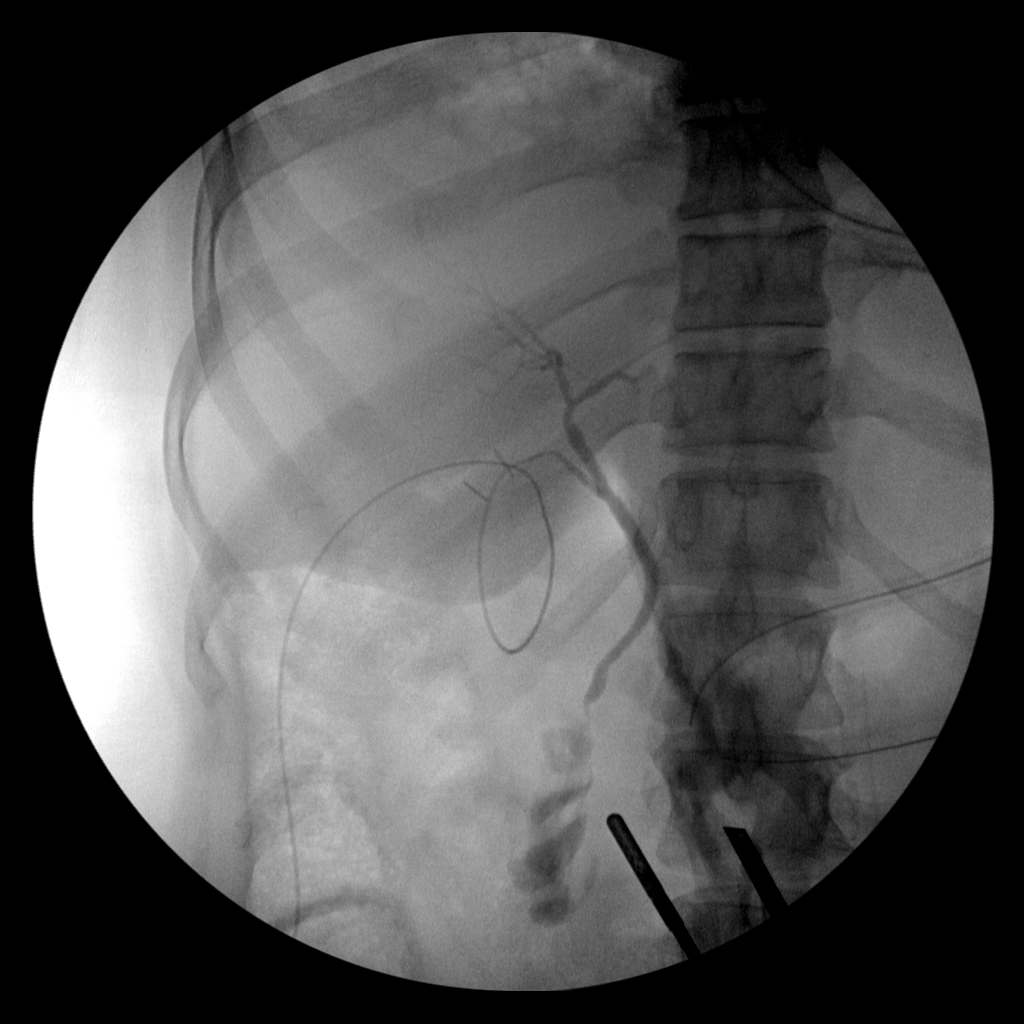
[frame 40/46]
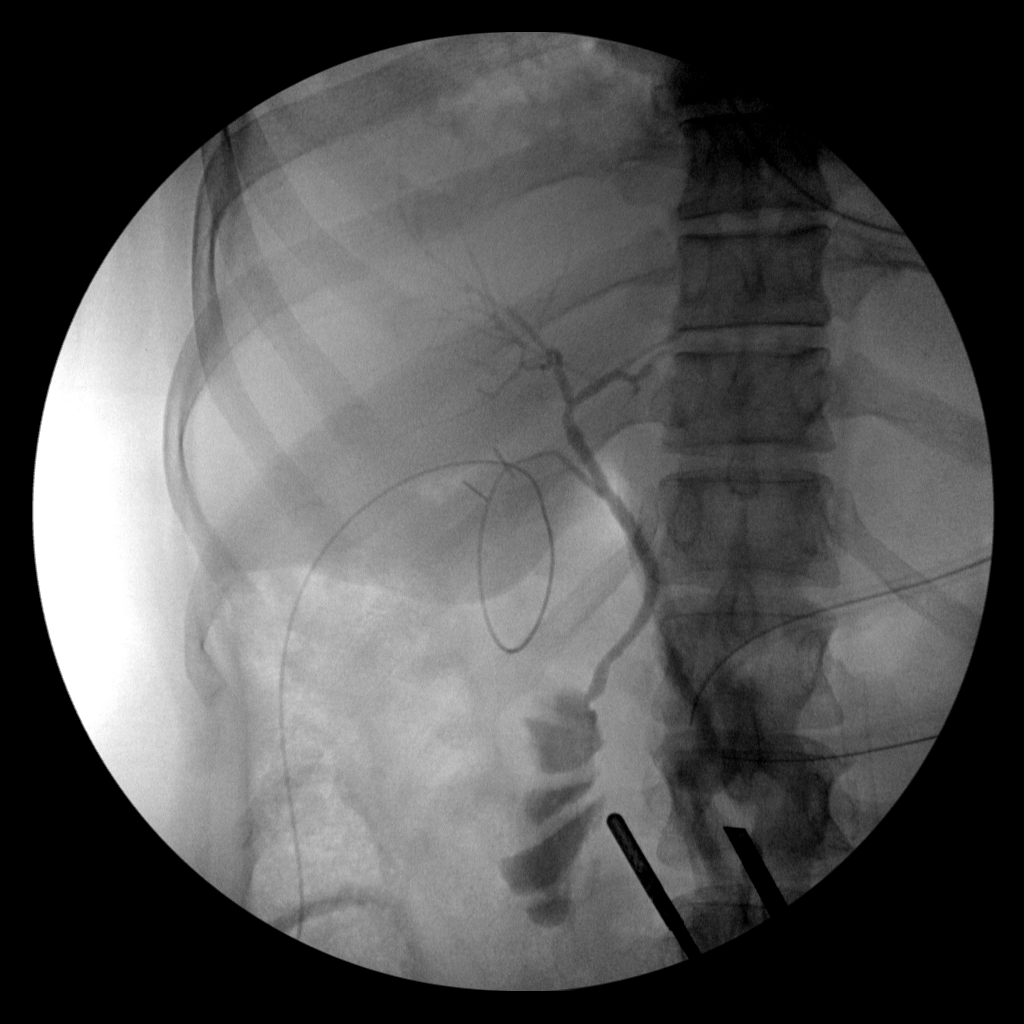
[frame 41/46]
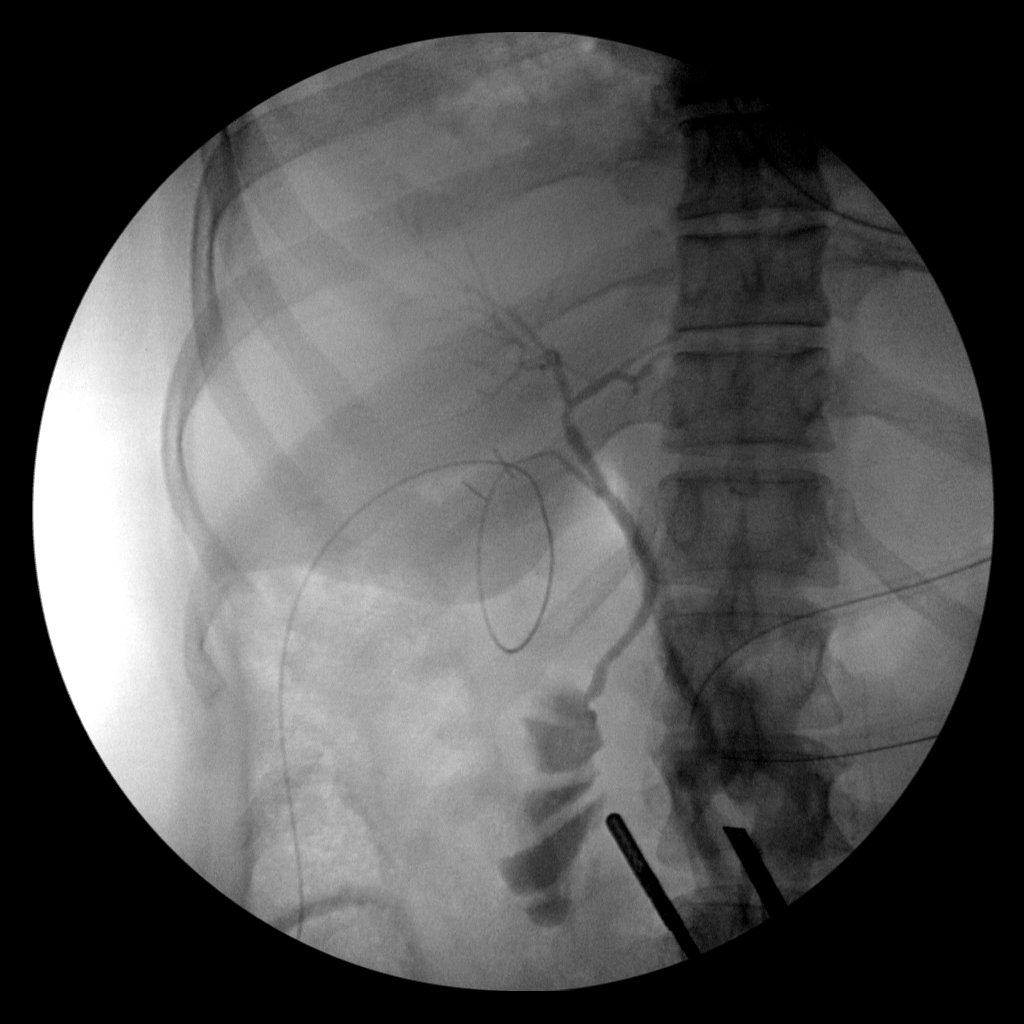

[4 of 4 positions shown; findings below may reference images not displayed]

FINDINGS: Intraoperative cholangiographic images of the right upper abdominal
quadrant during laparoscopic cholecystectomy are provided for
review.

Surgical clips overlie the expected location of the gallbladder
fossa.

Contrast injection demonstrates selective cannulation of the central
aspect of the cystic duct.

There is passage of contrast through the central aspect of the
cystic duct with filling of a non dilated common bile duct. There is
passage of contrast though the CBD and into the descending portion
of the duodenum.

There is minimal reflux of injected contrast into the common hepatic
duct and central aspect of the non dilated intrahepatic biliary
system.

There are no discrete filling defects within the opacified portions
of the biliary system to suggest the presence of
choledocholithiasis.
IMPRESSION: No evidence of choledocholithiasis.

## 2020-11-24 LAB — COLOGUARD: COLOGUARD: POSITIVE — AB

## 2021-10-01 ENCOUNTER — Emergency Department (HOSPITAL_COMMUNITY)
Admission: EM | Admit: 2021-10-01 | Discharge: 2021-10-01 | Disposition: A | Discharge status: Home / Self Care | Payer: Self-pay | Attending: Emergency Medicine | Admitting: Emergency Medicine

## 2021-10-01 ENCOUNTER — Encounter (HOSPITAL_COMMUNITY): Payer: Self-pay | Admitting: Emergency Medicine

## 2021-10-01 ENCOUNTER — Emergency Department (HOSPITAL_COMMUNITY): Payer: 59

## 2021-10-01 DIAGNOSIS — R1010 Upper abdominal pain, unspecified: Secondary | ICD-10-CM | POA: Insufficient documentation

## 2021-10-01 LAB — CBC
HCT: 43.2 % (ref 36.0–46.0)
Hemoglobin: 14.5 g/dL (ref 12.0–15.0)
MCH: 30.3 pg (ref 26.0–34.0)
MCHC: 33.6 g/dL (ref 30.0–36.0)
MCV: 90.2 fL (ref 80.0–100.0)
Platelets: 218 10*3/uL (ref 150–400)
RBC: 4.79 MIL/uL (ref 3.87–5.11)
RDW: 13.3 % (ref 11.5–15.5)
WBC: 9.4 10*3/uL (ref 4.0–10.5)
nRBC: 0 % (ref 0.0–0.2)

## 2021-10-01 LAB — COMPREHENSIVE METABOLIC PANEL
ALT: 24 U/L (ref 0–44)
AST: 37 U/L (ref 15–41)
Albumin: 4.2 g/dL (ref 3.5–5.0)
Alkaline Phosphatase: 50 U/L (ref 38–126)
Anion gap: 9 (ref 5–15)
BUN: 11 mg/dL (ref 6–20)
CO2: 22 mmol/L (ref 22–32)
Calcium: 9.5 mg/dL (ref 8.9–10.3)
Chloride: 106 mmol/L (ref 98–111)
Creatinine, Ser: 0.62 mg/dL (ref 0.44–1.00)
GFR, Estimated: >60 mL/min (ref >60)
Glucose, Bld: 100 mg/dL — ABNORMAL HIGH (ref 70–99)
Potassium: 3.3 mmol/L — ABNORMAL LOW (ref 3.5–5.1)
Sodium: 137 mmol/L (ref 135–145)
Total Bilirubin: 0.7 mg/dL (ref 0.3–1.2)
Total Protein: 7 g/dL (ref 6.5–8.1)

## 2021-10-01 LAB — LIPASE, BLOOD: Lipase: 57 U/L — ABNORMAL HIGH (ref 11–51)

## 2021-10-01 MED ORDER — IOHEXOL 300 MG/ML  SOLN
100.0000 mL | Freq: Once | INTRAMUSCULAR | Status: AC | PRN
  Administered 2021-10-01: 100 mL via INTRAVENOUS

## 2021-10-01 MED ORDER — PANTOPRAZOLE SODIUM 40 MG PO TBEC: 40.0000 mg | DELAYED_RELEASE_TABLET | Freq: Every day | ORAL | 3 refills | Status: AC

## 2021-10-01 MED ORDER — LACTATED RINGERS IV BOLUS
1000.0000 mL | Freq: Once | INTRAVENOUS | Status: AC
  Administered 2021-10-01: 1000 mL via INTRAVENOUS

## 2021-10-01 NOTE — ED Notes (Signed)
Went over [employer]. All questions answered. Iv removed. Ambulatory to lobby with family.

## 2021-10-01 NOTE — ED Triage Notes (Signed)
Pt BIB [facility] from home tonight c/o severe upper abd pain after drinking 2 cranberry and vodka drinks tonight. Pt also c/o anxiety.

## 2021-10-01 NOTE — ED Provider Notes (Signed)
[facility] EMERGENCY DEPARTMENT Provider Note   CSN: [id number] Arrival date & time: 10/01/21  0315     History  Chief Complaint  Patient presents with   Abdominal Pain    Lacey Reyes is a 51 y.o. female.  Patient presents to the emergency department for evaluation of abdominal pain.  Patient reports severe upper abdominal pain.  She reports that she has had similar episodes in the past.  She thinks it is her hiatal hernia.       Home Medications Prior to Admission medications   Medication Sig Start Date End Date Taking? Authorizing Provider  pantoprazole (PROTONIX) 40 MG tablet Take 1 tablet (40 mg total) by mouth daily. 10/01/21  Yes Sakeena Teall, Canary Brim, MD  acetaminophen (TYLENOL) 500 MG tablet Take 1,000 mg by mouth 2 (two) times daily as needed for moderate pain or headache.    [provider]  diphenhydrAMINE (BENADRYL) 25 MG tablet Take 25 mg by mouth every 6 (six) hours as needed for itching or sleep.    [provider]  levocetirizine (XYZAL) 5 MG tablet Take 5 mg by mouth at bedtime.    [provider]  ranitidine (ZANTAC) 75 MG tablet Take 150 mg by mouth daily as needed for heartburn.    [provider]  traMADol (ULTRAM) 50 MG tablet Take 1-2 tablets (50-100 mg total) by mouth every 6 (six) hours as needed for moderate pain or severe pain. 09/28/17   Karie Soda, MD      Allergies    Penicillins, Adhesive [tape], Ambien [zolpidem tartrate], Hydrocodone, Other, and Oxycodone    Review of Systems   Review of Systems  Physical Exam Updated Vital Signs BP 118/86   Pulse 75   Resp 20   Ht 5\' 9"  (1.753 m)   Wt 68.9 kg   SpO2 100%   BMI 22.45 kg/m  Physical Exam Vitals and nursing note reviewed.  Constitutional:      General: She is not in acute distress.    Appearance: She is well-developed.  HENT:     Head: Normocephalic and atraumatic.     Mouth/Throat:     Mouth: Mucous membranes are moist.  Eyes:      General: Vision grossly intact. Gaze aligned appropriately.     Extraocular Movements: Extraocular movements intact.     Conjunctiva/sclera: Conjunctivae normal.  Cardiovascular:     Rate and Rhythm: Normal rate and regular rhythm.     Pulses: Normal pulses.     Heart sounds: Normal heart sounds, S1 normal and S2 normal. No murmur heard.    No friction rub. No gallop.  Pulmonary:     Effort: Pulmonary effort is normal. No respiratory distress.     Breath sounds: Normal breath sounds.  Abdominal:     General: Bowel sounds are normal.     Palpations: Abdomen is soft.     Tenderness: There is abdominal tenderness in the right upper quadrant, epigastric area and left upper quadrant. There is no guarding or rebound.     Hernia: No hernia is present.  Musculoskeletal:        General: No swelling.     Cervical back: Full passive range of motion without pain, normal range of motion and neck supple. No spinous process tenderness or muscular tenderness. Normal range of motion.     Right lower leg: No edema.     Left lower leg: No edema.  Skin:    General: Skin is warm and  dry.     Capillary Refill: Capillary refill takes less than 2 seconds.     Findings: No ecchymosis, erythema, rash or wound.  Neurological:     General: No focal deficit present.     Mental Status: She is alert and oriented to person, place, and time.     GCS: GCS eye subscore is 4. GCS verbal subscore is 5. GCS motor subscore is 6.     Cranial Nerves: Cranial nerves 2-12 are intact.     Sensory: Sensation is intact.     Motor: Motor function is intact.     Coordination: Coordination is intact.  Psychiatric:        Attention and Perception: Attention normal.        Mood and Affect: Mood normal.        Speech: Speech normal.        Behavior: Behavior normal.     ED Results / Procedures / Treatments   Labs (all labs ordered are listed, but only abnormal results are displayed) Labs Reviewed  LIPASE, BLOOD - Abnormal;  Notable for the following components:      Result Value   Lipase 57 (*)    All other components within normal limits  COMPREHENSIVE METABOLIC PANEL - Abnormal; Notable for the following components:   Potassium 3.3 (*)    Glucose, Bld 100 (*)    All other components within normal limits  CBC  URINALYSIS, ROUTINE W REFLEX MICROSCOPIC    EKG None  Radiology CT ABDOMEN PELVIS W CONTRAST  Result Date: 10/01/2021 CLINICAL DATA:  Abdominal pain EXAM: CT ABDOMEN AND PELVIS WITH CONTRAST TECHNIQUE: Multidetector CT imaging of the abdomen and pelvis was performed using the standard protocol following bolus administration of intravenous contrast. RADIATION DOSE REDUCTION: This exam was performed according to the departmental dose-optimization program which includes automated exposure control, adjustment of the mA and/or kV according to patient size and/or use of iterative reconstruction technique. CONTRAST:  OMNIPAQUE IOHEXOL 300 MG/ML  SOLN COMPARISON:  None Available. FINDINGS: Lower chest: No acute abnormality. Hepatobiliary: No focal liver abnormality. Status post cholecystectomy. No bile duct dilatation. Pancreas: Unremarkable. No pancreatic ductal dilatation or surrounding inflammatory changes. Spleen: Normal in size without focal abnormality. Adrenals/Urinary Tract: Bilateral adrenal nodules. Left adrenal nodule measures 1.8 x 1.1 cm and 88 Hounsfield units , image 25/2. Right adrenal nodule measures 1.2 x 1.0 cm and 76 Hounsfield units, image 27/2. No nephrolithiasis, hydronephrosis or suspicious mass. Urinary bladder is unremarkable. There is no nephrolithiasis or hydronephrosis identified. No suspicious kidney mass identified. Urinary bladder is unremarkable. Stomach/Bowel: Stomach appears normal. The appendix is visualized and appears within normal limits. No small bowel wall thickening, inflammation or distension. Unremarkable appearance of the colon. Vascular/Lymphatic: No significant  vascular findings are present. No enlarged abdominal or pelvic lymph nodes. Reproductive: Uterus and adnexal structures are unremarkable. Other: No free fluid or fluid collections identified. No signs of pneumoperitoneum. Musculoskeletal: No acute or significant osseous findings. IMPRESSION: 1. No acute findings within the abdomen or pelvis. 2. Bilateral adrenal nodules. In the absence of known malignancy these most likely represent benign adenomas. Consider 12 month follow-up examination with adrenal protocol CT. JACR 2017 Aug; 14(8):1038-44, JCAT 2016 Mar-Apr; 40(2):194-200, Urol J 2006 Spring; 3(2):71-4. Electronically Signed   By: Signa Kell M.D.   On: 10/01/2021 05:26    Procedures Procedures    Medications Ordered in ED Medications  lactated ringers bolus 1,000 mL (1,000 mLs Intravenous New Bag/Given 10/01/21 0540)  iohexol (  OMNIPAQUE) 300 MG/ML solution 100 mL (100 mLs Intravenous Contrast Given 10/01/21 0503)    ED Course/ Medical Decision Making/ A&P                           Medical Decision Making Amount and/or Complexity of Data Reviewed Labs: ordered. Radiology: ordered.  Risk Prescription drug management.   Patient presents to the emergency department for evaluation of severe upper abdominal pain and cramping.  Patient reports that she has had 2 other similar episodes in the past, has attributed it to her hiatal hernia.  In the past she would vomit and the pain would resolve.  Pain has not gone away today.  Patient uncomfortable at arrival.  Patient declined pain medication.  Work-up was initiated, patient hydrated.  Pain has resolved spontaneously.  Work-up reassuring including CT scan.  No further work-up necessary.  Recommend initiating PPI.        Final Clinical Impression(s) / ED Diagnoses Final diagnoses:  Pain of upper abdomen    Rx / DC Orders ED Discharge Orders          Ordered    pantoprazole (PROTONIX) 40 MG tablet  Daily        10/01/21 0556               Gilda Crease, MD 10/01/21 [phone]

## 2024-02-14 ENCOUNTER — Emergency Department (HOSPITAL_BASED_OUTPATIENT_CLINIC_OR_DEPARTMENT_OTHER): Admitting: Radiology

## 2024-02-14 ENCOUNTER — Other Ambulatory Visit: Payer: Self-pay

## 2024-02-14 ENCOUNTER — Emergency Department (HOSPITAL_BASED_OUTPATIENT_CLINIC_OR_DEPARTMENT_OTHER)
Admission: EM | Admit: 2024-02-14 | Discharge: 2024-02-14 | Disposition: A | Discharge status: Home / Self Care | Attending: Emergency Medicine | Admitting: Emergency Medicine

## 2024-02-14 ENCOUNTER — Encounter (HOSPITAL_BASED_OUTPATIENT_CLINIC_OR_DEPARTMENT_OTHER): Payer: Self-pay

## 2024-02-14 DIAGNOSIS — D696 Thrombocytopenia, unspecified: Secondary | ICD-10-CM | POA: Diagnosis not present

## 2024-02-14 DIAGNOSIS — J45909 Unspecified asthma, uncomplicated: Secondary | ICD-10-CM | POA: Diagnosis not present

## 2024-02-14 DIAGNOSIS — Z7951 Long term (current) use of inhaled steroids: Secondary | ICD-10-CM | POA: Insufficient documentation

## 2024-02-14 DIAGNOSIS — Z79899 Other long term (current) drug therapy: Secondary | ICD-10-CM | POA: Diagnosis not present

## 2024-02-14 DIAGNOSIS — R051 Acute cough: Secondary | ICD-10-CM | POA: Diagnosis present

## 2024-02-14 DIAGNOSIS — D72819 Decreased white blood cell count, unspecified: Secondary | ICD-10-CM | POA: Diagnosis not present

## 2024-02-14 DIAGNOSIS — R062 Wheezing: Secondary | ICD-10-CM

## 2024-02-14 DIAGNOSIS — Z87891 Personal history of nicotine dependence: Secondary | ICD-10-CM | POA: Diagnosis not present

## 2024-02-14 DIAGNOSIS — I509 Heart failure, unspecified: Secondary | ICD-10-CM | POA: Diagnosis not present

## 2024-02-14 DIAGNOSIS — I110 Hypertensive heart disease with heart failure: Secondary | ICD-10-CM | POA: Insufficient documentation

## 2024-02-14 LAB — CBC
HCT: 39.3 % (ref 36.0–46.0)
Hemoglobin: 13.2 g/dL (ref 12.0–15.0)
MCH: 30.8 pg (ref 26.0–34.0)
MCHC: 33.6 g/dL (ref 30.0–36.0)
MCV: 91.8 fL (ref 80.0–100.0)
Platelets: 104 K/uL — ABNORMAL LOW (ref 150–400)
RBC: 4.28 MIL/uL (ref 3.87–5.11)
RDW: 13.8 % (ref 11.5–15.5)
WBC: 2.1 K/uL — ABNORMAL LOW (ref 4.0–10.5)
nRBC: 0 % (ref 0.0–0.2)

## 2024-02-14 LAB — BASIC METABOLIC PANEL WITH GFR
Anion gap: 12 (ref 5–15)
BUN: 15 mg/dL (ref 6–20)
CO2: 21 mmol/L — ABNORMAL LOW (ref 22–32)
Calcium: 8.8 mg/dL — ABNORMAL LOW (ref 8.9–10.3)
Chloride: 106 mmol/L (ref 98–111)
Creatinine, Ser: 0.67 mg/dL (ref 0.44–1.00)
GFR, Estimated: >60 mL/min
Glucose, Bld: 103 mg/dL — ABNORMAL HIGH (ref 70–99)
Potassium: 4.3 mmol/L (ref 3.5–5.1)
Sodium: 139 mmol/L (ref 135–145)

## 2024-02-14 LAB — TROPONIN T, HIGH SENSITIVITY: Troponin T High Sensitivity: <6 ng/L (ref 0–19)

## 2024-02-14 MED ORDER — ALBUTEROL SULFATE HFA 108 (90 BASE) MCG/ACT IN AERS
2.0000 | INHALATION_SPRAY | Freq: Once | RESPIRATORY_TRACT | Status: AC
  Administered 2024-02-14: 2 via RESPIRATORY_TRACT
  Filled 2024-02-14: qty 6.7

## 2024-02-14 MED ORDER — DEXAMETHASONE SOD PHOSPHATE PF 10 MG/ML IJ SOLN
10.0000 mg | Freq: Once | INTRAMUSCULAR | Status: AC
  Administered 2024-02-14: 10 mg via INTRAMUSCULAR
  Filled 2024-02-14: qty 1

## 2024-02-14 MED ORDER — AEROCHAMBER PLUS FLO-VU MEDIUM MISC
1.0000 | Freq: Once | Status: AC
  Administered 2024-02-14: 1

## 2024-02-14 MED ORDER — DOXYCYCLINE HYCLATE 100 MG PO CAPS: 100.0000 mg | ORAL_CAPSULE | Freq: Two times a day (BID) | ORAL | 0 refills | Status: DC

## 2024-02-14 MED ORDER — DEXAMETHASONE SOD PHOSPHATE PF 10 MG/ML IJ SOLN: 10.0000 mg | Freq: Once | INTRAMUSCULAR | Status: DC

## 2024-02-14 MED ORDER — IPRATROPIUM-ALBUTEROL 0.5-2.5 (3) MG/3ML IN SOLN
3.0000 mL | Freq: Once | RESPIRATORY_TRACT | Status: AC
  Administered 2024-02-14: 3 mL via RESPIRATORY_TRACT
  Filled 2024-02-14: qty 3

## 2024-02-14 MED ORDER — DOXYCYCLINE HYCLATE 100 MG PO CAPS: 100.0000 mg | ORAL_CAPSULE | Freq: Two times a day (BID) | ORAL | 0 refills | Status: AC

## 2024-02-14 NOTE — Discharge Instructions (Addendum)
 You were seen today for cough and wheezing.  Additionally noting that you had low thrombocytes and white blood cells which is very common after a viral illness.  However would still recommend to follow-up with your primary care this next week to get blood redrawn and evaluate improvement.  Return to the ER for any new or worsening symptoms.

## 2024-02-14 NOTE — ED Provider Notes (Signed)
 " [place] [facility] AT [facility] Provider Note   CSN: [id number] Arrival date & time: 02/14/24  2031     Patient presents with: Cough   Lacey Reyes is a 54 y.o. female.   Cough Patient is a 54 year old female with previous medical history of anxiety, asthma, HTN presenting the ED today for concerns for persistent cough x 1 week, noted to had flulike symptoms initially in the beginning of the week which is since improved noted to have improvement of bodyaches, chills, shortness of breath, fever.  With previously being exposed to flu.  Presents today with persistent cough noting to hear crackles when laying down specifically.  As well as noted to have some mild chest pain with cough but not on exertion.  Denies exertional dyspnea, fever, abdominal pain, nausea, vomiting, diarrhea, dysuria, lower leg swelling.    Prior to Admission medications  Medication Sig Start Date End Date Taking? Authorizing Provider  acetaminophen  (TYLENOL ) 500 MG tablet Take 1,000 mg by mouth 2 (two) times daily as needed for moderate pain or headache.    [provider]  diphenhydrAMINE (BENADRYL) 25 MG tablet Take 25 mg by mouth every 6 (six) hours as needed for itching or sleep.    [provider]  levocetirizine (XYZAL) 5 MG tablet Take 5 mg by mouth at bedtime.    [provider]  pantoprazole  (PROTONIX ) 40 MG tablet Take 1 tablet (40 mg total) by mouth daily. 10/01/21   Haze Lonni PARAS, MD  ranitidine (ZANTAC) 75 MG tablet Take 150 mg by mouth daily as needed for heartburn.    [provider]  traMADol  (ULTRAM ) 50 MG tablet Take 1-2 tablets (50-100 mg total) by mouth every 6 (six) hours as needed for moderate pain or severe pain. 09/28/17   Sheldon Standing, MD    Allergies: Penicillins, Adhesive [tape], Ambien [zolpidem tartrate], Hydrocodone, Other, and Oxycodone    Review of Systems  Respiratory:  Positive for cough.   All other systems  reviewed and are negative.   Updated Vital Signs BP (!) 143/99   Pulse 70   Temp 98.3 F (36.8 C) (Oral)   Resp 18   SpO2 98%   Physical Exam Vitals and nursing note reviewed.  Constitutional:      General: She is not in acute distress.    Appearance: Normal appearance. She is not ill-appearing or diaphoretic.  HENT:     Head: Normocephalic and atraumatic.  Eyes:     General: No scleral icterus.       Right eye: No discharge.        Left eye: No discharge.     Extraocular Movements: Extraocular movements intact.     Conjunctiva/sclera: Conjunctivae normal.  Cardiovascular:     Rate and Rhythm: Normal rate and regular rhythm.     Pulses: Normal pulses.     Heart sounds: Normal heart sounds. No murmur heard.    No friction rub. No gallop.  Pulmonary:     Effort: Pulmonary effort is normal. No respiratory distress.     Breath sounds: No stridor. Wheezing and rhonchi present. No rales.     Comments: Bilateral rhonchi and wheezing noted. Chest:     Chest wall: No tenderness.  Abdominal:     General: Abdomen is flat. There is no distension.     Palpations: Abdomen is soft.     Tenderness: There is no abdominal tenderness. There is no right CVA tenderness, left CVA tenderness, guarding or  rebound.  Musculoskeletal:        General: No swelling, deformity or signs of injury.     Cervical back: Normal range of motion. No rigidity.     Right lower leg: No edema.     Left lower leg: No edema.  Skin:    General: Skin is warm and dry.     Findings: No bruising, erythema or lesion.  Neurological:     General: No focal deficit present.     Mental Status: She is alert and oriented to person, place, and time. Mental status is at baseline.     Sensory: No sensory deficit.     Motor: No weakness.  Psychiatric:        Mood and Affect: Mood normal.     (all labs ordered are listed, but only abnormal results are displayed) Labs Reviewed  BASIC METABOLIC PANEL WITH GFR - Abnormal;  Notable for the following components:      Result Value   CO2 21 (*)    Glucose, Bld 103 (*)    Calcium 8.8 (*)    All other components within normal limits  CBC - Abnormal; Notable for the following components:   WBC 2.1 (*)    Platelets 104 (*)    All other components within normal limits  TROPONIN T, HIGH SENSITIVITY  TROPONIN T, HIGH SENSITIVITY    EKG: None  Radiology: DG Chest 2 View Result Date: 02/14/2024 EXAM: 2 VIEW(S) XRAY OF THE CHEST 02/14/2024 09:00:00 PM COMPARISON: None available. CLINICAL HISTORY: Cough Cough Cough Cough FINDINGS: LUNGS AND PLEURA: No focal pulmonary opacity. No pleural effusion. No pneumothorax. HEART AND MEDIASTINUM: No acute abnormality of the cardiac and mediastinal silhouettes. BONES AND SOFT TISSUES: No acute osseous abnormality. IMPRESSION: 1. No acute process. Electronically signed by: Franky Crease MD 02/14/2024 09:05 PM EST RP Workstation: [id number]   Procedures   Medications Ordered in the ED - No data to display    {Click here for ABCD2, HEART and other calculators REFRESH Note before signing:1}                              Medical Decision Making Amount and/or Complexity of Data Reviewed Labs: ordered. Radiology: ordered.  This patient is a ***  who presents to the ED for concern of ***.   Differential diagnoses prior to evaluation: The emergent differential diagnosis includes, but is not limited to,  *** . This is not an exhaustive differential.   Past Medical History / Co-morbidities / Social History: ***  Additional history: Chart reviewed. Pertinent results include:   Last seen by [facility] medicine on 11/14/2023.  For GERD, HTN and vaginal atrophy  Lab Tests/Imaging studies: I personally interpreted labs/imaging and the pertinent results include: CBC notes a leukocytosis of 2.1 and thrombocytopenia 104.   ***I agree with the radiologist interpretation.  Cardiac monitoring: EKG obtained and interpreted by myself and  attending physician which shows: ***   Medications: I ordered medication including ***.  I have reviewed the patients home medicines and have made adjustments as needed.  Critical Interventions:  Social Determinants of Health:  Disposition: After consideration of the diagnostic results and the patients response to treatment, I feel that the patient would benefit from ***.   ***emergency department workup does not suggest an emergent condition requiring admission or immediate intervention beyond what has been performed at this time. The plan is: ***. The patient is safe for discharge  and has been instructed to return immediately for worsening symptoms, change in symptoms or any other concerns.  Final diagnoses:  None    ED Discharge Orders     None        "

## 2024-02-14 NOTE — ED Notes (Signed)
 Patient refused covid/flu/RSV swab

## 2024-02-14 NOTE — ED Triage Notes (Signed)
 Pt presents via POV c/o cough. Reports cough has been persistent since last Saturday. Reports possible flu exposure. Reports flu like symptoms since being exposed. Reports cough has not improved. Also reports she is on Lisinopril. Currently afebrile in triage.

## 2024-02-20 ENCOUNTER — Ambulatory Visit: Payer: Self-pay | Admitting: Internal Medicine
